# Patient Record
Sex: Female | Born: 1945 | Race: White | Hispanic: No | Marital: Married | Smoking: Never smoker
Health system: Southern US, Community
[De-identification: ages and names within clinical notes are randomized; demographics above are authoritative.]

## PROBLEM LIST (undated history)

## (undated) DIAGNOSIS — F419 Anxiety disorder, unspecified: Secondary | ICD-10-CM

## (undated) DIAGNOSIS — E079 Disorder of thyroid, unspecified: Secondary | ICD-10-CM

## (undated) DIAGNOSIS — E7800 Pure hypercholesterolemia, unspecified: Secondary | ICD-10-CM

## (undated) DIAGNOSIS — I10 Essential (primary) hypertension: Secondary | ICD-10-CM

## (undated) HISTORY — PX: ABDOMINAL HYSTERECTOMY: SHX81

## (undated) HISTORY — PX: CHOLECYSTECTOMY: SHX55

---

## 1999-09-04 ENCOUNTER — Other Ambulatory Visit: Admission: RE | Admit: 1999-09-04 | Discharge: 1999-09-04 | Payer: Self-pay | Admitting: Gynecology

## 2000-07-16 ENCOUNTER — Encounter: Admission: RE | Admit: 2000-07-16 | Discharge: 2000-07-16 | Payer: Self-pay | Admitting: Gynecology

## 2000-07-16 ENCOUNTER — Encounter: Payer: Self-pay | Admitting: Gynecology

## 2001-08-06 ENCOUNTER — Encounter: Admission: RE | Admit: 2001-08-06 | Discharge: 2001-08-06 | Payer: Self-pay | Admitting: Gynecology

## 2001-08-06 ENCOUNTER — Encounter: Payer: Self-pay | Admitting: Gynecology

## 2002-08-20 ENCOUNTER — Encounter: Payer: Self-pay | Admitting: Gynecology

## 2002-08-20 ENCOUNTER — Encounter: Admission: RE | Admit: 2002-08-20 | Discharge: 2002-08-20 | Payer: Self-pay | Admitting: Gynecology

## 2002-10-07 ENCOUNTER — Other Ambulatory Visit: Admission: RE | Admit: 2002-10-07 | Discharge: 2002-10-07 | Payer: Self-pay | Admitting: Gynecology

## 2003-08-31 ENCOUNTER — Encounter: Admission: RE | Admit: 2003-08-31 | Discharge: 2003-08-31 | Payer: Self-pay | Admitting: Gynecology

## 2004-09-25 ENCOUNTER — Encounter: Admission: RE | Admit: 2004-09-25 | Discharge: 2004-09-25 | Payer: Self-pay | Admitting: Gynecology

## 2004-11-01 ENCOUNTER — Other Ambulatory Visit: Admission: RE | Admit: 2004-11-01 | Discharge: 2004-11-01 | Payer: Self-pay | Admitting: Gynecology

## 2005-01-15 ENCOUNTER — Encounter: Admission: RE | Admit: 2005-01-15 | Discharge: 2005-01-15 | Payer: Self-pay | Admitting: Family Medicine

## 2005-01-22 ENCOUNTER — Encounter: Admission: RE | Admit: 2005-01-22 | Discharge: 2005-01-22 | Payer: Self-pay | Admitting: Family Medicine

## 2005-03-27 ENCOUNTER — Encounter (INDEPENDENT_AMBULATORY_CARE_PROVIDER_SITE_OTHER): Payer: Self-pay | Admitting: Specialist

## 2005-03-27 ENCOUNTER — Ambulatory Visit (HOSPITAL_COMMUNITY): Admission: RE | Admit: 2005-03-27 | Discharge: 2005-03-27 | Payer: Self-pay | Admitting: General Surgery

## 2005-11-12 ENCOUNTER — Encounter: Admission: RE | Admit: 2005-11-12 | Discharge: 2005-11-12 | Payer: Self-pay | Admitting: Gynecology

## 2006-11-17 ENCOUNTER — Encounter: Admission: RE | Admit: 2006-11-17 | Discharge: 2006-11-17 | Payer: Self-pay | Admitting: Gynecology

## 2007-04-24 ENCOUNTER — Encounter: Admission: RE | Admit: 2007-04-24 | Discharge: 2007-04-24 | Payer: Self-pay | Admitting: Family Medicine

## 2007-12-14 ENCOUNTER — Encounter: Admission: RE | Admit: 2007-12-14 | Discharge: 2007-12-14 | Payer: Self-pay | Admitting: Gynecology

## 2008-01-25 ENCOUNTER — Encounter: Admission: RE | Admit: 2008-01-25 | Discharge: 2008-01-25 | Payer: Self-pay | Admitting: Family Medicine

## 2008-06-25 IMAGING — US US ABDOMEN COMPLETE
1 series · 14 of 25 positions shown · non-contrast
Comparison: NONE

CLINICAL DATA: ATTN:Quirijn Amazigh Right upper quadrant pain 

ABDOMINAL ULTRASOUND

[Series 1: us abd · 0.32mm/px · 14 of 51 slices shown]
[im 1/51]
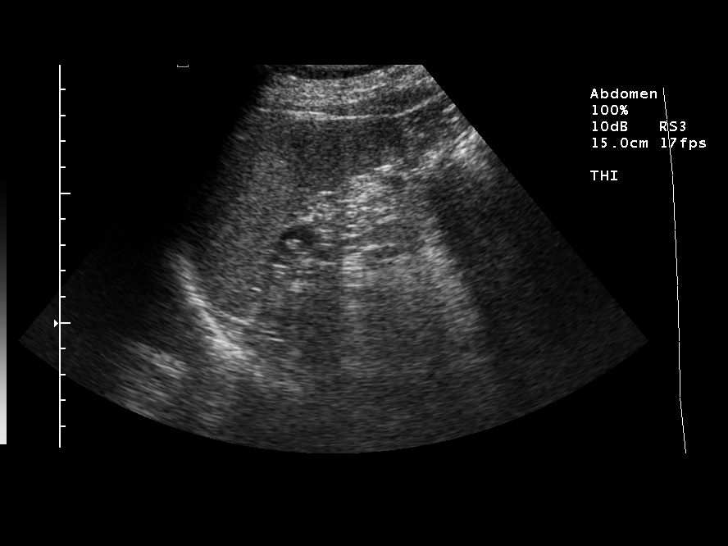
[im 5/51]
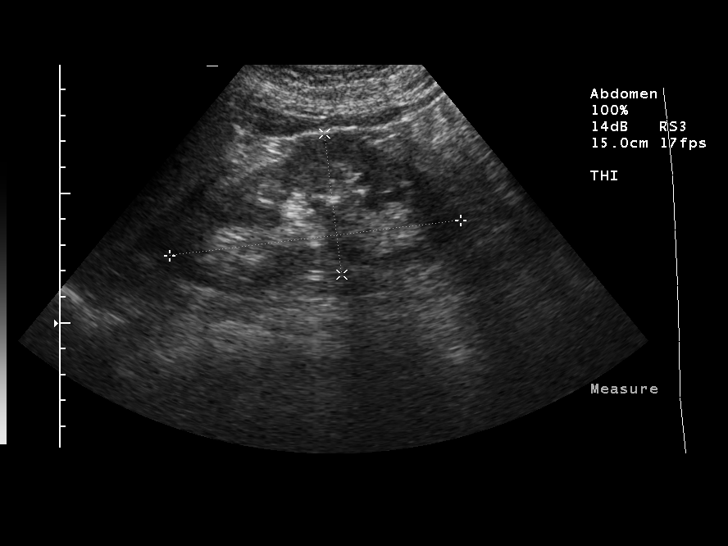
[im 9/51]
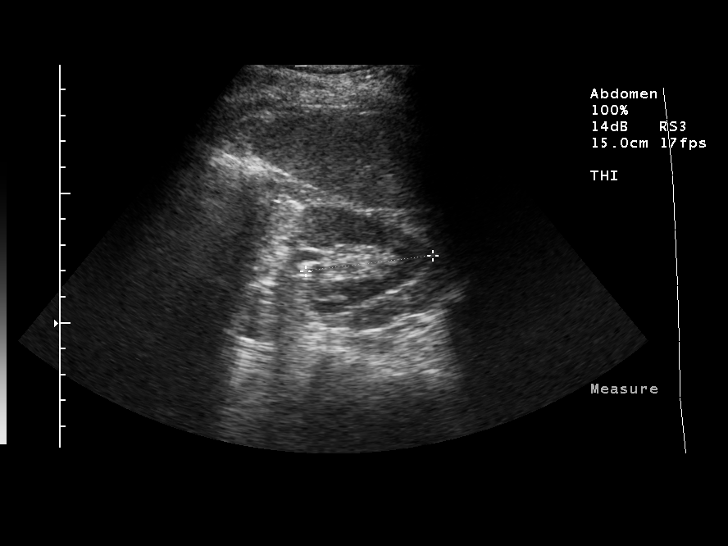
[im 13/51]
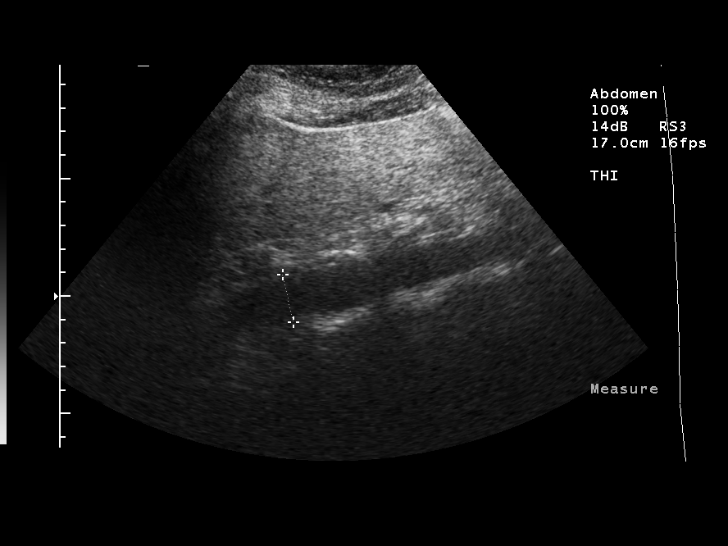
[im 17/51]
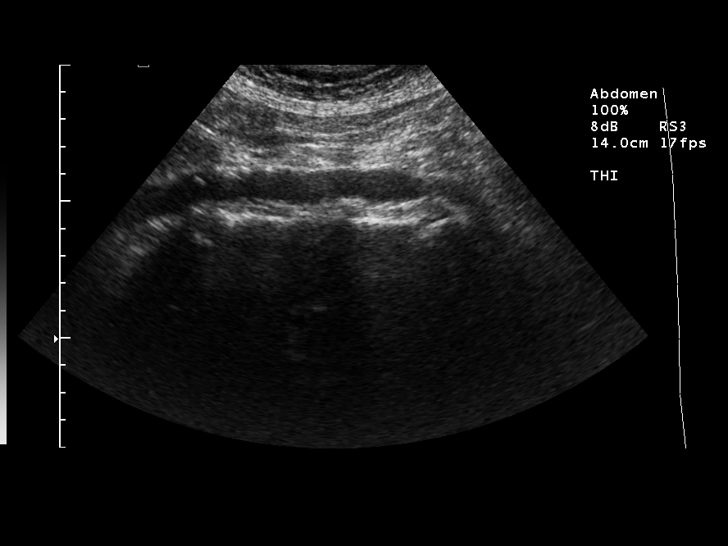
[im 19/51]
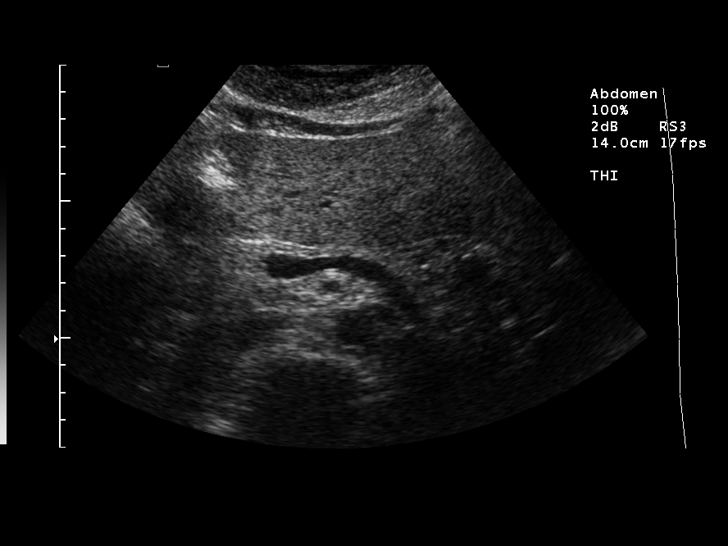
[im 23/51]
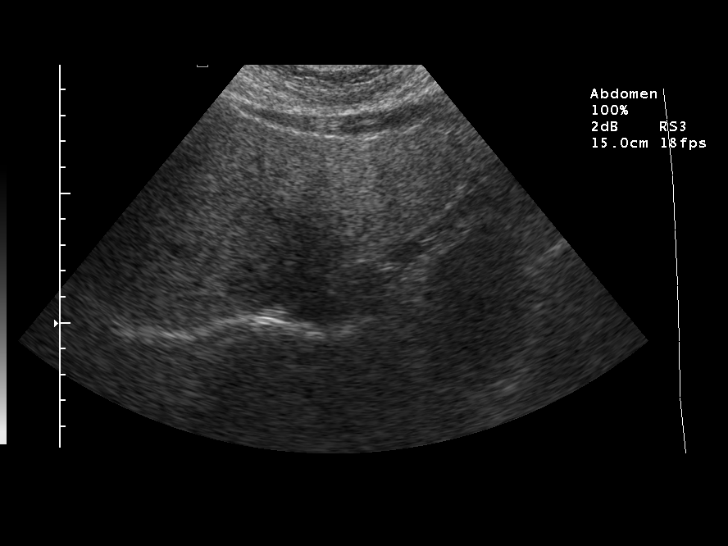
[im 28/51]
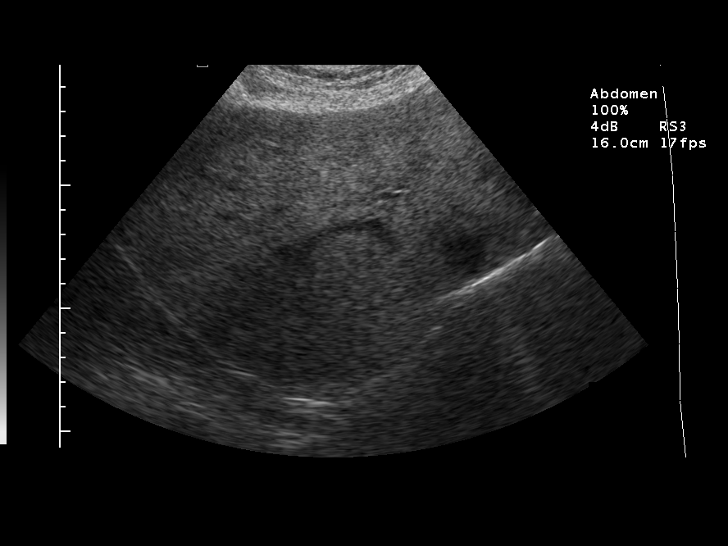
[im 32/51]
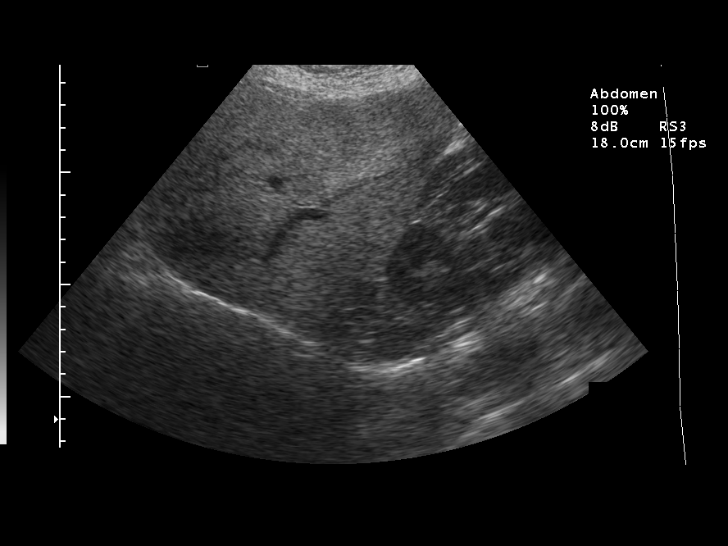
[im 34/51]
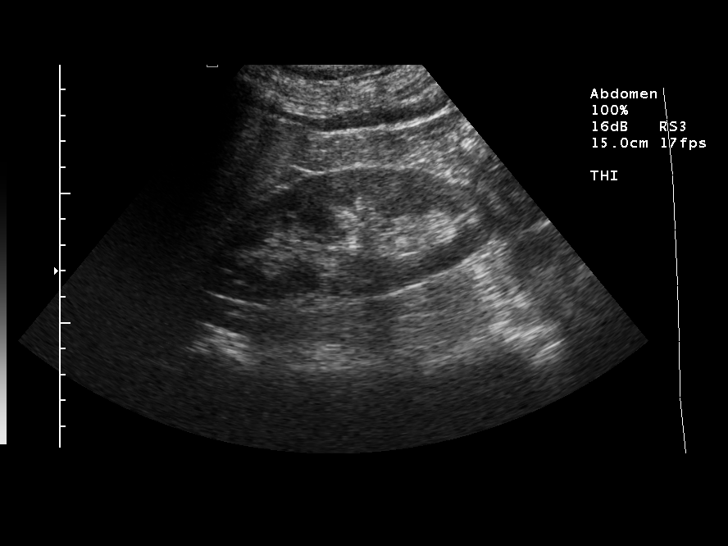
[im 38/51]
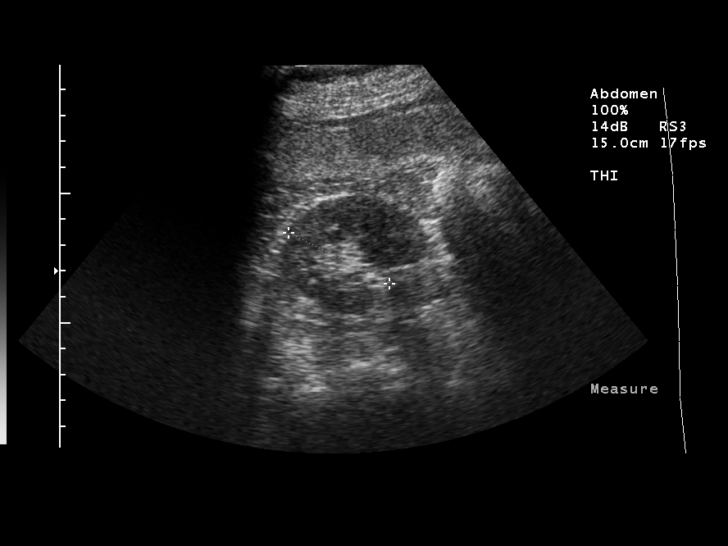
[im 42/51]
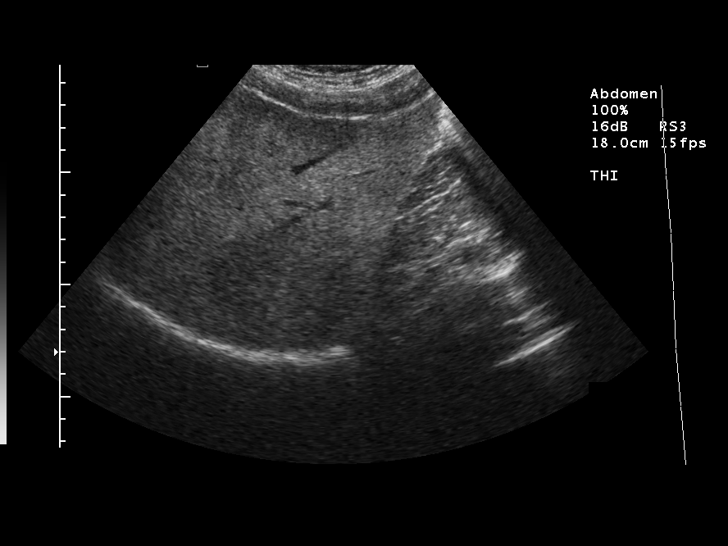
[im 46/51]
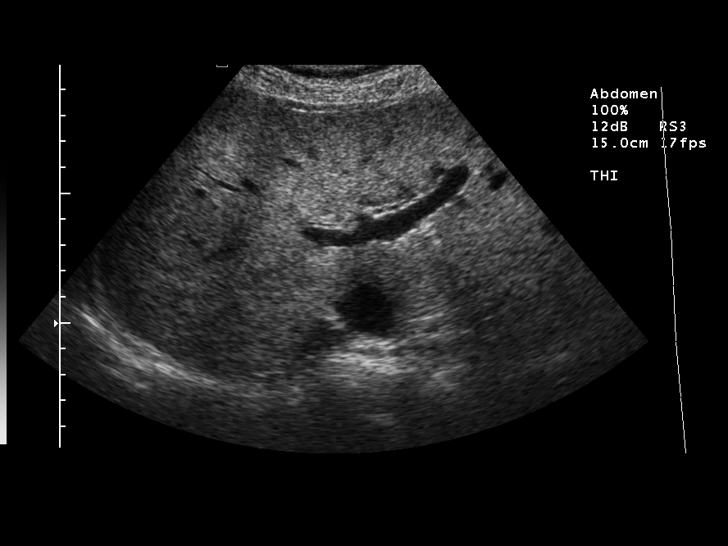
[im 51/51]
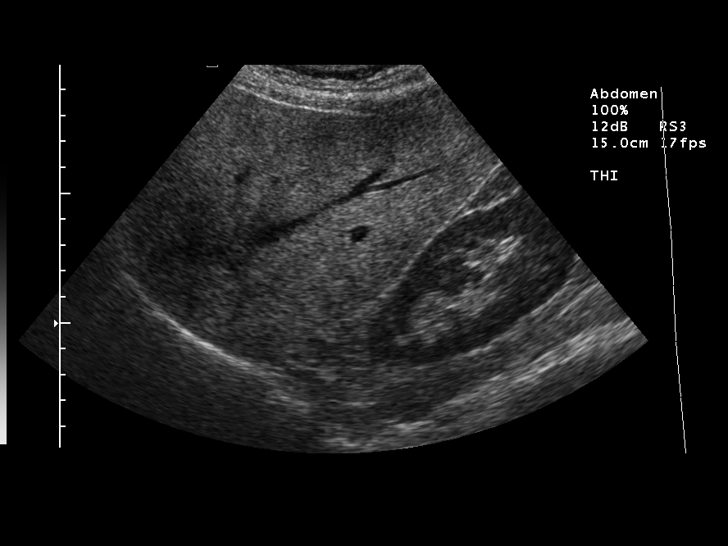

[14 of 25 positions shown; findings below may reference images not displayed]

FINDINGS: The liver is abnormal, showing mixed echotexture and 
size within normal limits. No discrete masses are visible. Surface 
is smooth. No ascites or varices. Spleen is 10 cm in length. 
Pancreas is unremarkable. Gallbladder is surgically absent. The 
common duct is 4 mm. The aorta is normal in caliber, mid diameter 
1.4 cm. Kidneys appear normal, renal lengths of 11.7 and 11.3 cm 
on the right and left respectively.
IMPRESSION: Abnormal liver echotexture, rule out focal fatty liver 
or other hepatic parenchymal disease. Marybell Issa, M.D. 
Date: 12/09/2006 [facility]  [REDACTED]

## 2009-01-24 ENCOUNTER — Encounter: Admission: RE | Admit: 2009-01-24 | Discharge: 2009-01-24 | Payer: Self-pay | Admitting: Gynecology

## 2010-11-18 ENCOUNTER — Encounter: Payer: Self-pay | Admitting: Gynecology

## 2011-03-15 NOTE — Op Note (Signed)
Kristine Carson, Kristine Carson               ACCOUNT NO.:  [id number]   MEDICAL RECORD NO.:  [id number]          PATIENT TYPE:  AMB   LOCATION:  DAY                          FACILITY:  [facility]   PHYSICIAN:  Ollen Gross. Vernell Morgans, M.D. DATE OF BIRTH:  11/15/1945   DATE OF PROCEDURE:  03/27/2005  DATE OF DISCHARGE:                                 OPERATIVE REPORT   PREOPERATIVE DIAGNOSIS:  Gallstones.   POSTOPERATIVE DIAGNOSIS:  Gallstones.   PROCEDURE:  Laparoscopic cholecystectomy with intraoperative cholangiogram.   SURGEON:  Ollen Gross. Carolynne Edouard, M.D.   ASSISTANT:  Anselm Pancoast. Zachery Dakins, M.D.   ANESTHESIA:  General endotracheal   DESCRIPTION OF PROCEDURE:  After informed consent was obtained, the patient  was brought to the operating room and placed in the supine position on the  operating table.  After adequate induction of general anesthesia, the  patient's abdomen was prepped with Betadine and draped in the usual sterile  manner.  The area below the umbilicus was infiltrated with 1/4% Marcaine.  A  small incision was made with a 15-blade knife.  This incision was carried  down through the subcutaneous tissue bluntly with a hemostat and [employer]  retractors until the linea alba was identified. The linea alba was incised  with a 15-blade knife and each side was grasped with Kocher clamps and  elevated anteriorly.   The preperitoneal space was then probed bluntly with a hemostat until the  peritoneum was opened and access was gained to the abdominal cavity.  A #0  Vicryl pursestring stitch was placed in the fascia surrounding the opening.  A Hasson cannula was placed through the opening and an anchored in place  with a 3-0 Vicryl pursestring stitch.  The abdomen was then insufflated with  carbon dioxide without difficulty.  The patient was placed in the head-up  position and rotated slightly with the right side up.  A laparoscope was  placed through the Hasson cannula and the right upper quadrant  was  inspected.  The dome of the gallbladder and liver readily identified.   Next, the epigastric region was infiltrated with 1/4% Marcaine.  A small  incision was made with the 15-blade knife.  A 10-mm port was then placed  bluntly through this incision into the abdominal cavity under direct vision.  Sites were then chosen laterally on the right side of the abdomen for  placement of 5-mm ports.  Each of these areas were infiltrated with 1/4%  Marcaine.   Small stab incisions were made with the 15-blade knife.  Then 5-mm ports  were placed bluntly through these incisions into the abdominal cavity under  direct vision.  A blunt grasper was placed through the lateral most 5-mm  port and used to grasp the dome of the gallbladder and elevate it anteriorly  and superiorly.  Some omental adhesions to the gallbladder body were then  taken down by blunt dissection with a dissector.  Another blunt dissector  was placed through the other 5-mm port and used to retract on the body and  neck of the gallbladder.   A dissector was  placed through the epigastric port and using the  electrocautery the peritoneal reflection of the gallbladder neck was opened.  Blunt dissection was then carried out into this area until the gallbladder  and the cystic duct junction was identified and dissected circumferentially  until a good window was created.  A single clip was placed proximally on the  gallbladder neck.  A small ductotomy was made just below the clip with the  laparoscopic scissors.  The 14-gauge Angiocath was placed percutaneously  through the anterior abdominal wall under direct vision.  A Reddick  cholangiogram catheter was placed through the Angiocath and flushed.  The  Reddick catheter was then placed within the cystic duct and anchored in  place with a clip.  A cholangiogram was obtained that showed no filling  defects, good emptying into the duodenum and adequate length on the cystic  duct.  The  anchoring clip and catheters were then removed from the patient.  Three clips were placed proximally on the cystic duct the was divided  between the two sets of clips.  Posterior to this, the cystic artery, with a  branch, was identified and dissected bluntly in a circumferential manner  until a good window was created.  Two clips were placed proximally and one  distally on the each branch of the artery; and the artery was divided  between the two.   Next, a laparoscopic hook cautery device was used to separate the  gallbladder from the liver bed.  Prior to completely detaching the  gallbladder from the liver bed, the liver bed was inspected and several  small bleeding points were coagulated with the electrocautery until the area  was completely hemostatic.  The gallbladder was then detached the rest of  the way from the liver bed without difficulty.  The laparoscope was then  moved to the epigastric port and a gallbladder grasper was placed through  the Hasson cannula and used to grasp the neck of the gallbladder.  The  gallbladder with the Hasson cannula was then removed through the  infraumbilical port without difficulty.  The fascial defect was then closed  with the previously placed Vicryl pursestring stitch as well as with another  figure-of-eight #0 Vicryl stitch.  The abdomen was then irrigated with  copious amounts of saline until the irrigant was clear.   The liver bed was inspected, again, and found to be completely hemostatic.  The rest of the ports were removed under direct vision and were all found to  be hemostatic.  The gas was allowed to escape.  The skin incisions were all  closed with interrupted 4-0 Monocryl subcuticular stitches.  Benzoin and  Steri-Strips were applied.  The patient tolerated the procedure well.  At  the end of the case all needle, sponge, and instrument counts were correct.  The patient was then awakened and taken to recovery room in  stable condition.       PST/MEDQ  D:  03/27/2005  T:  03/27/2005  Job:  [id number]

## 2012-07-15 ENCOUNTER — Other Ambulatory Visit: Payer: Self-pay | Admitting: Physician Assistant

## 2012-07-15 DIAGNOSIS — Z780 Asymptomatic menopausal state: Secondary | ICD-10-CM

## 2012-07-15 DIAGNOSIS — Z1231 Encounter for screening mammogram for malignant neoplasm of breast: Secondary | ICD-10-CM

## 2012-08-03 ENCOUNTER — Ambulatory Visit
Admission: RE | Admit: 2012-08-03 | Discharge: 2012-08-03 | Disposition: A | Discharge status: Home / Self Care | Payer: Medicare Other | Source: Ambulatory Visit | Attending: Physician Assistant | Admitting: Physician Assistant

## 2012-08-03 DIAGNOSIS — Z780 Asymptomatic menopausal state: Secondary | ICD-10-CM

## 2012-08-03 DIAGNOSIS — Z1231 Encounter for screening mammogram for malignant neoplasm of breast: Secondary | ICD-10-CM

## 2012-08-06 ENCOUNTER — Other Ambulatory Visit: Payer: Self-pay | Admitting: Physician Assistant

## 2012-08-06 DIAGNOSIS — R928 Other abnormal and inconclusive findings on diagnostic imaging of breast: Secondary | ICD-10-CM

## 2012-08-11 ENCOUNTER — Ambulatory Visit
Admission: RE | Admit: 2012-08-11 | Discharge: 2012-08-11 | Disposition: A | Discharge status: Home / Self Care | Payer: Medicare Other | Source: Ambulatory Visit | Attending: Physician Assistant | Admitting: Physician Assistant

## 2012-08-11 DIAGNOSIS — R928 Other abnormal and inconclusive findings on diagnostic imaging of breast: Secondary | ICD-10-CM

## 2012-09-22 ENCOUNTER — Other Ambulatory Visit: Payer: Self-pay | Admitting: Physician Assistant

## 2012-11-11 ENCOUNTER — Other Ambulatory Visit: Payer: Self-pay | Admitting: Physician Assistant

## 2013-02-02 ENCOUNTER — Other Ambulatory Visit: Payer: Self-pay | Admitting: Physician Assistant

## 2013-08-09 ENCOUNTER — Other Ambulatory Visit: Payer: Self-pay

## 2013-08-09 DIAGNOSIS — Z1231 Encounter for screening mammogram for malignant neoplasm of breast: Secondary | ICD-10-CM

## 2013-09-02 ENCOUNTER — Ambulatory Visit
Admission: RE | Admit: 2013-09-02 | Discharge: 2013-09-02 | Disposition: A | Discharge status: Home / Self Care | Payer: Medicare Other | Source: Ambulatory Visit

## 2013-09-02 DIAGNOSIS — Z1231 Encounter for screening mammogram for malignant neoplasm of breast: Secondary | ICD-10-CM

## 2014-01-17 ENCOUNTER — Other Ambulatory Visit: Payer: Self-pay | Admitting: Gastroenterology

## 2014-06-20 ENCOUNTER — Other Ambulatory Visit: Payer: Self-pay | Admitting: Physician Assistant

## 2014-06-20 DIAGNOSIS — E2839 Other primary ovarian failure: Secondary | ICD-10-CM

## 2014-08-02 ENCOUNTER — Other Ambulatory Visit: Payer: Self-pay | Admitting: Physician Assistant

## 2014-08-02 DIAGNOSIS — Z1239 Encounter for other screening for malignant neoplasm of breast: Secondary | ICD-10-CM

## 2014-08-19 ENCOUNTER — Other Ambulatory Visit: Payer: Self-pay

## 2014-08-19 ENCOUNTER — Other Ambulatory Visit: Payer: Self-pay | Admitting: Physician Assistant

## 2014-08-19 DIAGNOSIS — Z1231 Encounter for screening mammogram for malignant neoplasm of breast: Secondary | ICD-10-CM

## 2014-09-05 ENCOUNTER — Other Ambulatory Visit: Payer: Self-pay | Admitting: Physician Assistant

## 2014-09-05 DIAGNOSIS — M810 Age-related osteoporosis without current pathological fracture: Secondary | ICD-10-CM

## 2014-09-07 ENCOUNTER — Encounter (INDEPENDENT_AMBULATORY_CARE_PROVIDER_SITE_OTHER): Payer: Self-pay

## 2014-09-07 ENCOUNTER — Ambulatory Visit
Admission: RE | Admit: 2014-09-07 | Discharge: 2014-09-07 | Disposition: A | Discharge status: Home / Self Care | Payer: Medicare HMO | Source: Ambulatory Visit

## 2014-09-07 DIAGNOSIS — Z1231 Encounter for screening mammogram for malignant neoplasm of breast: Secondary | ICD-10-CM

## 2014-09-13 ENCOUNTER — Ambulatory Visit
Admission: RE | Admit: 2014-09-13 | Discharge: 2014-09-13 | Disposition: A | Discharge status: Home / Self Care | Payer: Medicare HMO | Source: Ambulatory Visit | Attending: Physician Assistant | Admitting: Physician Assistant

## 2014-09-13 DIAGNOSIS — M810 Age-related osteoporosis without current pathological fracture: Secondary | ICD-10-CM

## 2015-09-27 ENCOUNTER — Other Ambulatory Visit: Payer: Self-pay

## 2015-09-27 DIAGNOSIS — Z1231 Encounter for screening mammogram for malignant neoplasm of breast: Secondary | ICD-10-CM

## 2015-10-24 ENCOUNTER — Ambulatory Visit
Admission: RE | Admit: 2015-10-24 | Discharge: 2015-10-24 | Disposition: A | Discharge status: Home / Self Care | Payer: Medicare HMO | Source: Ambulatory Visit

## 2015-10-24 DIAGNOSIS — Z1231 Encounter for screening mammogram for malignant neoplasm of breast: Secondary | ICD-10-CM

## 2016-09-14 ENCOUNTER — Emergency Department (HOSPITAL_COMMUNITY): Payer: Medicare HMO

## 2016-09-14 ENCOUNTER — Encounter (HOSPITAL_COMMUNITY): Payer: Self-pay | Admitting: *Deleted

## 2016-09-14 ENCOUNTER — Emergency Department (HOSPITAL_COMMUNITY)
Admission: EM | Admit: 2016-09-14 | Discharge: 2016-09-14 | Disposition: A | Discharge status: Home / Self Care | Payer: Medicare HMO | Attending: Emergency Medicine | Admitting: Emergency Medicine

## 2016-09-14 DIAGNOSIS — Y92410 Unspecified street and highway as the place of occurrence of the external cause: Secondary | ICD-10-CM | POA: Insufficient documentation

## 2016-09-14 DIAGNOSIS — I10 Essential (primary) hypertension: Secondary | ICD-10-CM | POA: Diagnosis not present

## 2016-09-14 DIAGNOSIS — S20212A Contusion of left front wall of thorax, initial encounter: Secondary | ICD-10-CM | POA: Diagnosis not present

## 2016-09-14 DIAGNOSIS — Y939 Activity, unspecified: Secondary | ICD-10-CM | POA: Diagnosis not present

## 2016-09-14 DIAGNOSIS — S0081XA Abrasion of other part of head, initial encounter: Secondary | ICD-10-CM | POA: Diagnosis not present

## 2016-09-14 DIAGNOSIS — R52 Pain, unspecified: Secondary | ICD-10-CM

## 2016-09-14 DIAGNOSIS — Y999 Unspecified external cause status: Secondary | ICD-10-CM | POA: Insufficient documentation

## 2016-09-14 DIAGNOSIS — S40812A Abrasion of left upper arm, initial encounter: Secondary | ICD-10-CM | POA: Insufficient documentation

## 2016-09-14 DIAGNOSIS — S299XXA Unspecified injury of thorax, initial encounter: Secondary | ICD-10-CM | POA: Diagnosis present

## 2016-09-14 HISTORY — DX: Disorder of thyroid, unspecified: E07.9

## 2016-09-14 HISTORY — DX: Pure hypercholesterolemia, unspecified: E78.00

## 2016-09-14 HISTORY — DX: Anxiety disorder, unspecified: F41.9

## 2016-09-14 HISTORY — DX: Essential (primary) hypertension: I10

## 2016-09-14 MED ORDER — ACETAMINOPHEN 500 MG PO TABS
1000.0000 mg | ORAL_TABLET | Freq: Once | ORAL | Status: AC
  Administered 2016-09-14: 1000 mg via ORAL
  Filled 2016-09-14: qty 2

## 2016-09-14 NOTE — ED Notes (Signed)
Pt to go next for CT scan
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
## 2016-09-14 NOTE — ED Notes (Signed)
Pt ambulatory to D35; pt fully dressed. Informed pt she is waiting for a CT scan on neck. Pt is not wearing a c-collar. Steady gait noted

## 2016-09-14 NOTE — ED Notes (Signed)
ED Provider at bedside. 

## 2016-10-04 ENCOUNTER — Other Ambulatory Visit: Payer: Self-pay | Admitting: Physician Assistant

## 2016-10-04 DIAGNOSIS — M810 Age-related osteoporosis without current pathological fracture: Secondary | ICD-10-CM

## 2016-10-10 ENCOUNTER — Other Ambulatory Visit: Payer: Self-pay | Admitting: Physician Assistant

## 2016-10-10 DIAGNOSIS — Z1231 Encounter for screening mammogram for malignant neoplasm of breast: Secondary | ICD-10-CM

## 2016-11-18 ENCOUNTER — Ambulatory Visit
Admission: RE | Admit: 2016-11-18 | Discharge: 2016-11-18 | Disposition: A | Discharge status: Home / Self Care | Payer: Medicare HMO | Source: Ambulatory Visit | Attending: Physician Assistant | Admitting: Physician Assistant

## 2016-11-18 DIAGNOSIS — M810 Age-related osteoporosis without current pathological fracture: Secondary | ICD-10-CM

## 2016-11-18 DIAGNOSIS — Z1231 Encounter for screening mammogram for malignant neoplasm of breast: Secondary | ICD-10-CM

## 2017-12-24 ENCOUNTER — Other Ambulatory Visit: Payer: Self-pay | Admitting: Physician Assistant

## 2017-12-24 DIAGNOSIS — Z139 Encounter for screening, unspecified: Secondary | ICD-10-CM

## 2018-01-13 ENCOUNTER — Ambulatory Visit
Admission: RE | Admit: 2018-01-13 | Discharge: 2018-01-13 | Disposition: A | Discharge status: Home / Self Care | Payer: Medicare HMO | Source: Ambulatory Visit | Attending: Physician Assistant | Admitting: Physician Assistant

## 2018-01-13 DIAGNOSIS — Z139 Encounter for screening, unspecified: Secondary | ICD-10-CM

## 2018-11-20 ENCOUNTER — Other Ambulatory Visit: Payer: Self-pay | Admitting: Physician Assistant

## 2018-11-20 DIAGNOSIS — Z1231 Encounter for screening mammogram for malignant neoplasm of breast: Secondary | ICD-10-CM

## 2018-11-20 DIAGNOSIS — E2839 Other primary ovarian failure: Secondary | ICD-10-CM

## 2019-01-19 ENCOUNTER — Other Ambulatory Visit: Payer: Medicare HMO

## 2019-01-19 ENCOUNTER — Ambulatory Visit: Payer: Medicare HMO

## 2019-02-25 ENCOUNTER — Other Ambulatory Visit: Payer: Medicare HMO

## 2019-02-25 ENCOUNTER — Ambulatory Visit: Payer: Medicare HMO

## 2019-04-16 ENCOUNTER — Ambulatory Visit
Admission: RE | Admit: 2019-04-16 | Discharge: 2019-04-16 | Disposition: A | Discharge status: Home / Self Care | Payer: Medicare HMO | Source: Ambulatory Visit | Attending: Physician Assistant | Admitting: Physician Assistant

## 2019-04-16 ENCOUNTER — Other Ambulatory Visit: Payer: Self-pay

## 2019-04-16 DIAGNOSIS — Z1231 Encounter for screening mammogram for malignant neoplasm of breast: Secondary | ICD-10-CM

## 2019-04-16 DIAGNOSIS — E2839 Other primary ovarian failure: Secondary | ICD-10-CM

## 2019-09-02 ENCOUNTER — Other Ambulatory Visit: Payer: Self-pay

## 2019-09-02 DIAGNOSIS — Z20822 Contact with and (suspected) exposure to covid-19: Secondary | ICD-10-CM

## 2019-09-03 LAB — NOVEL CORONAVIRUS, NAA: SARS-CoV-2, NAA: NOT DETECTED

## 2020-04-21 ENCOUNTER — Other Ambulatory Visit: Payer: Self-pay | Admitting: Physician Assistant

## 2020-04-21 DIAGNOSIS — Z1231 Encounter for screening mammogram for malignant neoplasm of breast: Secondary | ICD-10-CM

## 2020-05-03 ENCOUNTER — Other Ambulatory Visit: Payer: Self-pay

## 2020-05-03 ENCOUNTER — Ambulatory Visit
Admission: RE | Admit: 2020-05-03 | Discharge: 2020-05-03 | Disposition: A | Discharge status: Home / Self Care | Payer: Medicare HMO | Source: Ambulatory Visit | Attending: Physician Assistant | Admitting: Physician Assistant

## 2020-05-03 DIAGNOSIS — Z1231 Encounter for screening mammogram for malignant neoplasm of breast: Secondary | ICD-10-CM

## 2020-06-12 ENCOUNTER — Other Ambulatory Visit: Payer: Self-pay

## 2020-06-12 ENCOUNTER — Ambulatory Visit: Payer: Medicare HMO | Attending: Nurse Practitioner | Admitting: Physical Therapy

## 2020-06-12 DIAGNOSIS — R293 Abnormal posture: Secondary | ICD-10-CM | POA: Diagnosis present

## 2020-06-12 DIAGNOSIS — M5442 Lumbago with sciatica, left side: Secondary | ICD-10-CM | POA: Diagnosis present

## 2020-06-12 NOTE — Therapy (Signed)
[facility] [address] [place], [place], [address] Phone: [phone]   Fax:  [phone]  Physical Therapy Evaluation  Patient Details  Name: Kristine Carson MRN: [id number] Date of Birth: 1946/09/01 Referring Provider (PT): Roseanna Rainbow NP.   Encounter Date: 06/12/2020   PT End of Session - 06/12/20 1201    Visit Number 1    Number of Visits 12    Date for PT Re-Evaluation 07/24/20    Authorization Type FOTO.    PT Start Time 1035    PT Stop Time 1126    PT Time Calculation (min) 51 min    Activity Tolerance Patient tolerated treatment well    Behavior During Therapy WFL for tasks assessed/performed           Past Medical History:  Diagnosis Date  . Anxiety   . High cholesterol   . Hypertension   . Thyroid disease     No past surgical history on file.  There were no vitals filed for this visit.    Subjective Assessment - 06/12/20 1214    Subjective COVID-19 screen performed prior to patient entering clinic.  The patient presents to the clinic today with her daughter.  She reports the onset of low back pai with radiation down her left LE for about two months.  She feel it may have been related to attemtping to avoid a fall which involved twisting her spine.  She is not reporting any pain today but her pain can become very intense after long periods of standing and sitting described as sore, sharp and shooting.  She reports pain goes into her left inner thigh region and and radiates below her knee.    Pertinent History HTN, Thyroid disease, Cholecystectomy.    How long can you sit comfortably? Varies.    How long can you stand comfortably? Varies.    Patient Stated Goals Get out of pain.    Currently in Pain? No/denies              [facility] PT Assessment - 06/12/20 0001      Assessment   Medical Diagnosis Acute left-sided low back pain with left-sided sciatica.    Referring Provider (PT) Roseanna Rainbow NP.    Onset  Date/Surgical Date --   ~2 months.     Precautions   Precautions Fall      Restrictions   Weight Bearing Restrictions No      Balance Screen   Has the patient fallen in the past 6 months Yes   3 times from a squat position.   Has the patient had a decrease in activity level because of a fear of falling?  No    Is the patient reluctant to leave their home because of a fear of falling?  No      Home Environment   Living Environment Private residence      Prior Function   Level of Independence Independent      Posture/Postural Control   Posture/Postural Control Postural limitations    Postural Limitations Rounded Shoulders;Forward head;Decreased lumbar lordosis      Deep Tendon Reflexes   DTR Assessment Site Patella;Achilles    Patella DTR 2+    Achilles DTR --   RT= 1+/4+ and LT= 2+/4+.     ROM / Strength   AROM / PROM / Strength AROM;Strength      AROM   Overall AROM Comments Full active lumbar flexion and extension.  Strength   Overall Strength Comments Normal bilateral LE strength.      Palpation   Palpation comment Tender to palption over left SIJ and especially over her left upper gluteal region.      Special Tests   Other special tests (-) SLR, FABER and Sacral press testing.  Right LE longer than left.      Ambulation/Gait   Gait Comments Essentially normal this morning.                      Objective measurements completed on examination: See above findings.       [facility] Adult PT Treatment/Exercise - 06/12/20 0001      Modalities   Modalities Electrical Stimulation      Electrical Stimulation   Electrical Stimulation Location Left SIJ/upper glut.    Electrical Stimulation Action Pre-mod.    Electrical Stimulation Parameters 80-150 Hz x 20 minutes.    Electrical Stimulation Goals Tone;Pain                       PT Long Term Goals - 06/12/20 1240      PT LONG TERM GOAL #1   Title Independent with a HEP.    Time 6      Period Weeks    Status New      PT LONG TERM GOAL #2   Title Sit 30 minutes with pain not > 2-3/10.    Time 6    Period Weeks    Status New      PT LONG TERM GOAL #3   Title Stand 30 minutes with pain not > 2-3/10.    Time 6    Period Weeks    Status New      PT LONG TERM GOAL #4   Title Eliminate left LE symptoms.    Time 6    Period Weeks    Status New                  Plan - 06/12/20 1234    Clinical Impression Statement The patient presents to OPPT with c/o left sided low back pain and radiation of pain down her left LE.  This has been ongoing for about two months.  It is thought her pain may have been the result of attemtping to avoid a fall and twisting her spine.  She was found to be tender to palpation over her left SIJ and upper gluteal region.  Her pain can become very intense with prolonged sitting and standing.  Patient will benefit from skilled physical therapy intervention to address deficits and pain.    Personal Factors and Comorbidities Comorbidity 1;Comorbidity 2    Comorbidities HTN, Thyroid disease, Cholecystectomy.    Examination-Activity Limitations Bed Mobility;Squat;Lift;Other;Sit;Stand    Examination-Participation Restrictions Other    Stability/Clinical Decision Making Evolving/Moderate complexity    Clinical Decision Making Low    PT Frequency 2x / week    PT Duration 6 weeks    PT Treatment/Interventions ADLs/Self Care Home Management;Cryotherapy;Electrical Stimulation;Ultrasound;Traction;Moist Heat;Iontophoresis 4mg /ml Dexamethasone;Therapeutic activities;Therapeutic exercise;Manual techniques;Passive range of motion;Dry needling;Joint Manipulations;Spinal Manipulations    PT Next Visit Plan Adductor squeeze f/b left SKTC to equalize leg lengths, core exercise progression, combo e'stim/US and STW/M to patient's left SIJ/upper gluteal region.  Consider intermittment traction beginning at 35% body weight.    Consulted and Agree with Plan of  Care Patient           Patient will benefit from  skilled therapeutic intervention in order to improve the following deficits and impairments:  Pain, Decreased activity tolerance, Increased muscle spasms, Postural dysfunction  Visit Diagnosis: Acute left-sided low back pain with left-sided sciatica - Plan: PT plan of care cert/re-cert  Abnormal posture - Plan: PT plan of care cert/re-cert     Problem List There are no problems to display for this patient.   Kristine Carson, Kristine Carson 06/12/2020, 12:44 PM  [facility] [address] [place], [place], [address] Phone: [phone]   Fax:  [phone]  Name: Kristine Carson MRN: [id number] Date of Birth: 30-Nov-1945

## 2020-06-21 ENCOUNTER — Encounter: Payer: Self-pay | Admitting: Physical Therapy

## 2020-06-21 ENCOUNTER — Ambulatory Visit: Payer: Medicare HMO | Admitting: Physical Therapy

## 2020-06-21 ENCOUNTER — Other Ambulatory Visit: Payer: Self-pay

## 2020-06-21 DIAGNOSIS — R293 Abnormal posture: Secondary | ICD-10-CM

## 2020-06-21 DIAGNOSIS — M5442 Lumbago with sciatica, left side: Secondary | ICD-10-CM

## 2020-06-21 NOTE — Therapy (Signed)
[facility] [address] [place], [place], [address] Phone: [phone]   Fax:  [phone]  Physical Therapy Treatment  Patient Details  Name: Kristine Carson MRN: [id number] Date of Birth: 1946-10-06 Referring Provider (PT): Roseanna Rainbow NP.   Encounter Date: 06/21/2020   PT End of Session - 06/21/20 1225    Visit Number 2    Number of Visits 12    Date for PT Re-Evaluation 07/24/20    Authorization Type FOTO.    PT Start Time 0906    PT Stop Time 1000    PT Time Calculation (min) 54 min    Activity Tolerance Patient tolerated treatment well    Behavior During Therapy WFL for tasks assessed/performed           Past Medical History:  Diagnosis Date  . Anxiety   . High cholesterol   . Hypertension   . Thyroid disease     History reviewed. No pertinent surgical history.  There were no vitals filed for this visit.   Subjective Assessment - 06/21/20 1223    Subjective COVID 19 screening performed on patient upon arrival. Patient has TENS unit at home and reports benefit when used. Patient only has pain intermittantly and is still experiencing LLE pain.    Pertinent History HTN, Thyroid disease, Cholecystectomy.    How long can you sit comfortably? Varies.    How long can you stand comfortably? Varies.    Patient Stated Goals Get out of pain.    Currently in Pain? No/denies              [facility] PT Assessment - 06/21/20 0001      Assessment   Medical Diagnosis Acute left-sided low back pain with left-sided sciatica.    Referring Provider (PT) Roseanna Rainbow NP.      Precautions   Precautions Fall      Restrictions   Weight Bearing Restrictions No                         [facility] Adult PT Treatment/Exercise - 06/21/20 0001      Exercises   Exercises Lumbar      Lumbar Exercises: Stretches   Single Knee to Chest Stretch Left;3 reps;30 seconds      Lumbar Exercises: Supine   Ab Set 15 reps;5 seconds    Glut  Set 5 seconds;20 reps    Bridge 20 reps;5 seconds    Other Supine Lumbar Exercises Isometric LLE hip ext into wall x10 reps 10 sec    Other Supine Lumbar Exercises Adductor squeeze 3x30 sec      Modalities   Modalities Traction      Traction   Type of Traction Lumbar    Min (lbs) 5    Max (lbs) 58    Hold Time 99    Rest Time 5    Time 15                       PT Long Term Goals - 06/12/20 1240      PT LONG TERM GOAL #1   Title Independent with a HEP.    Time 6    Period Weeks    Status New      PT LONG TERM GOAL #2   Title Sit 30 minutes with pain not > 2-3/10.    Time 6    Period Weeks    Status New  PT LONG TERM GOAL #3   Title Stand 30 minutes with pain not > 2-3/10.    Time 6    Period Weeks    Status New      PT LONG TERM GOAL #4   Title Eliminate left LE symptoms.    Time 6    Period Weeks    Status New                 Plan - 06/21/20 1225    Clinical Impression Statement Patient presented in clinic with no current LBP but still has intermittant pain. Patient progress through corrective pelvic exercises and core strengthening without complaints of pain. Increased mutilmodal cueing provided throughout therex to ensure proper mobility. Traction initated at 58# max with patient educated to monitor symptoms until next appointment.    Personal Factors and Comorbidities Comorbidity 1;Comorbidity 2    Comorbidities HTN, Thyroid disease, Cholecystectomy.    Examination-Activity Limitations Bed Mobility;Squat;Lift;Other;Sit;Stand    Examination-Participation Restrictions Other    Stability/Clinical Decision Making Evolving/Moderate complexity    PT Frequency 2x / week    PT Duration 6 weeks    PT Treatment/Interventions ADLs/Self Care Home Management;Cryotherapy;Electrical Stimulation;Ultrasound;Traction;Moist Heat;Iontophoresis 4mg /ml Dexamethasone;Therapeutic activities;Therapeutic exercise;Manual techniques;Passive range of motion;Dry  needling;Joint Manipulations;Spinal Manipulations    PT Next Visit Plan Adductor squeeze f/b left SKTC to equalize leg lengths, core exercise progression, combo e'stim/US and STW/M to patient's left SIJ/upper gluteal region.  Consider intermittment traction beginning at 35% body weight.    Consulted and Agree with Plan of Care Patient           Patient will benefit from skilled therapeutic intervention in order to improve the following deficits and impairments:  Pain, Decreased activity tolerance, Increased muscle spasms, Postural dysfunction  Visit Diagnosis: Acute left-sided low back pain with left-sided sciatica  Abnormal posture     Problem List There are no problems to display for this patient.   , PTA 06/21/2020, 4:10 PM  [facility] [address] [place], [place], [place] Phone: [phone]   Fax:  [phone]  Name: Kristine Carson MRN: Honor Loh Date of Birth: 01/07/1946

## 2020-06-28 ENCOUNTER — Other Ambulatory Visit: Payer: Self-pay

## 2020-06-28 ENCOUNTER — Encounter: Payer: Self-pay | Admitting: Physical Therapy

## 2020-06-28 ENCOUNTER — Ambulatory Visit: Payer: Medicare HMO | Attending: Nurse Practitioner | Admitting: Physical Therapy

## 2020-06-28 DIAGNOSIS — R293 Abnormal posture: Secondary | ICD-10-CM | POA: Insufficient documentation

## 2020-06-28 DIAGNOSIS — M5442 Lumbago with sciatica, left side: Secondary | ICD-10-CM

## 2020-06-28 NOTE — Therapy (Signed)
[facility] [address] [place], [place], [address] Phone: [phone]   Fax:  [phone]  Physical Therapy Treatment  Patient Details  Name: Kristine Carson MRN: [id number] Date of Birth: September 08, 1946 Referring Provider (PT): Roseanna Rainbow NP.   Encounter Date: 06/28/2020   PT End of Session - 06/28/20 0950    Visit Number 3    Number of Visits 12    Date for PT Re-Evaluation 07/24/20    Authorization Type FOTO.    PT Start Time [phone]    PT Stop Time 1031    PT Time Calculation (min) 41 min    Activity Tolerance Patient tolerated treatment well    Behavior During Therapy WFL for tasks assessed/performed           Past Medical History:  Diagnosis Date  . Anxiety   . High cholesterol   . Hypertension   . Thyroid disease     History reviewed. No pertinent surgical history.  There were no vitals filed for this visit.   Subjective Assessment - 06/28/20 0949    Subjective COVID 19 screening performed on patient upon arrival. Patient reports that the second day after traction she had more R low to mid back pain. Had to use ice constantly.    Pertinent History HTN, Thyroid disease, Cholecystectomy.    How long can you sit comfortably? Varies.    How long can you stand comfortably? Varies.    Patient Stated Goals Get out of pain.    Currently in Pain? Yes    Pain Score 5     Pain Location Back    Pain Orientation Left;Mid;Lower    Pain Descriptors / Indicators Sore    Pain Type Chronic pain    Pain Onset In the past 7 days    Pain Frequency Constant              [facility] PT Assessment - 06/28/20 0001      Assessment   Medical Diagnosis Acute left-sided low back pain with left-sided sciatica.    Referring Provider (PT) Roseanna Rainbow NP.      Precautions   Precautions Fall      Restrictions   Weight Bearing Restrictions No                         [facility] Adult PT Treatment/Exercise - 06/28/20 0001       Modalities   Modalities Electrical Stimulation;Moist Heat;Ultrasound      Moist Heat Therapy   Number Minutes Moist Heat 15 Minutes    Moist Heat Location Lumbar Spine      Electrical Stimulation   Electrical Stimulation Location L lumbar paraspinals    Electrical Stimulation Action Pre-Mod    Electrical Stimulation Parameters 80-150 hz x10 mi    Electrical Stimulation Goals Tone;Pain      Ultrasound   Ultrasound Location L low back    Ultrasound Parameters Combo 1.5 w/cm2, 100%, 1 mhz x10 min    Ultrasound Goals Pain      Manual Therapy   Manual Therapy Soft tissue mobilization    Soft tissue mobilization STW to L lumbar paraspinals, QL to reduce tone and pain                       PT Long Term Goals - 06/12/20 1240      PT LONG TERM GOAL #1   Title Independent with a HEP.  Time 6    Period Weeks    Status New      PT LONG TERM GOAL #2   Title Sit 30 minutes with pain not > 2-3/10.    Time 6    Period Weeks    Status New      PT LONG TERM GOAL #3   Title Stand 30 minutes with pain not > 2-3/10.    Time 6    Period Weeks    Status New      PT LONG TERM GOAL #4   Title Eliminate left LE symptoms.    Time 6    Period Weeks    Status New                 Plan - 06/28/20 1220    Clinical Impression Statement Patient presented in clinic with reports of increased LBP since last treatment. Patient has used ice and TENS unit to control pain at home. Patient presented with mod tone of L lumbar paraspinals and QL. Normal modalities response noted following removal of the modalities. No negetive complaints following end of session.    Personal Factors and Comorbidities Comorbidity 1;Comorbidity 2    Comorbidities HTN, Thyroid disease, Cholecystectomy.    Examination-Activity Limitations Bed Mobility;Squat;Lift;Other;Sit;Stand    Examination-Participation Restrictions Other    Stability/Clinical Decision Making Evolving/Moderate complexity    PT  Frequency 2x / week    PT Duration 6 weeks    PT Treatment/Interventions ADLs/Self Care Home Management;Cryotherapy;Electrical Stimulation;Ultrasound;Traction;Moist Heat;Iontophoresis 4mg /ml Dexamethasone;Therapeutic activities;Therapeutic exercise;Manual techniques;Passive range of motion;Dry needling;Joint Manipulations;Spinal Manipulations    PT Next Visit Plan Adductor squeeze f/b left SKTC to equalize leg lengths, core exercise progression, combo e'stim/US and STW/M to patient's left SIJ/upper gluteal region.  Consider intermittment traction beginning at 35% body weight.    Consulted and Agree with Plan of Care Patient           Patient will benefit from skilled therapeutic intervention in order to improve the following deficits and impairments:  Pain, Decreased activity tolerance, Increased muscle spasms, Postural dysfunction  Visit Diagnosis: Acute left-sided low back pain with left-sided sciatica  Abnormal posture     Problem List There are no problems to display for this patient.   , PTA 06/28/2020, 12:22 PM  [facility] [address] [place], [place], [place] Phone: [phone]   Fax:  [phone]  Name: Kristine Carson MRN: Honor Loh Date of Birth: 02-Nov-1945

## 2020-07-05 ENCOUNTER — Encounter: Payer: Self-pay | Admitting: Physical Therapy

## 2020-07-05 ENCOUNTER — Other Ambulatory Visit: Payer: Self-pay

## 2020-07-05 ENCOUNTER — Ambulatory Visit: Payer: Medicare HMO | Admitting: Physical Therapy

## 2020-07-05 DIAGNOSIS — M5442 Lumbago with sciatica, left side: Secondary | ICD-10-CM | POA: Diagnosis not present

## 2020-07-05 DIAGNOSIS — R293 Abnormal posture: Secondary | ICD-10-CM

## 2020-07-05 NOTE — Therapy (Signed)
[facility] [address] [place], [place], [address] Phone: [phone]   Fax:  [phone]  Physical Therapy Treatment  Patient Details  Name: Kristine Carson MRN: [id number] Date of Birth: Jan 01, 1946 Referring Provider (PT): Roseanna Rainbow NP.   Encounter Date: 07/05/2020   PT End of Session - 07/05/20 1041    Visit Number 4    Number of Visits 12    Date for PT Re-Evaluation 07/24/20    Authorization Type FOTO.    PT Start Time 0945    PT Stop Time 1043    PT Time Calculation (min) 58 min    Activity Tolerance Patient tolerated treatment well    Behavior During Therapy WFL for tasks assessed/performed           Past Medical History:  Diagnosis Date  . Anxiety   . High cholesterol   . Hypertension   . Thyroid disease     History reviewed. No pertinent surgical history.  There were no vitals filed for this visit.   Subjective Assessment - 07/05/20 1040    Subjective COVID-19 screen performed prior to patient entering clinic.  Much better now.    Pertinent History HTN, Thyroid disease, Cholecystectomy.    How long can you sit comfortably? Varies.    How long can you stand comfortably? Varies.    Patient Stated Goals Get out of pain.    Currently in Pain? Yes    Pain Location Back    Pain Orientation Left;Mid    Pain Descriptors / Indicators Sore    Pain Onset In the past 7 days                             [facility] Adult PT Treatment/Exercise - 07/05/20 0001      Exercises   Exercises Knee/Hip      Lumbar Exercises: Aerobic   Nustep Level 4 x 10 minutes.      Modalities   Modalities Electrical Stimulation;Moist Heat      Moist Heat Therapy   Number Minutes Moist Heat 20 Minutes    Moist Heat Location Lumbar Spine      Electrical Stimulation   Electrical Stimulation Location Left low back/upper glut.    Electrical Stimulation Action IFC    Electrical Stimulation Parameters 80-150 hz x 20 minutes.       Manual Therapy   Manual Therapy Soft tissue mobilization    Soft tissue mobilization RT sdly position with pillow between knees for comfort:  STW x 13 minutes to patient's left low back, SIJ and upper glut.                       PT Long Term Goals - 06/12/20 1240      PT LONG TERM GOAL #1   Title Independent with a HEP.    Time 6    Period Weeks    Status New      PT LONG TERM GOAL #2   Title Sit 30 minutes with pain not > 2-3/10.    Time 6    Period Weeks    Status New      PT LONG TERM GOAL #3   Title Stand 30 minutes with pain not > 2-3/10.    Time 6    Period Weeks    Status New      PT LONG TERM GOAL #4   Title Eliminate left  LE symptoms.    Time 6    Period Weeks    Status New                 Plan - 07/05/20 1112    Clinical Impression Statement Patient feeling much better.  No reported pain after treatment.    Personal Factors and Comorbidities Comorbidity 1;Comorbidity 2    Comorbidities HTN, Thyroid disease, Cholecystectomy.    Examination-Activity Limitations Bed Mobility;Squat;Lift;Other;Sit;Stand    Examination-Participation Restrictions Other    Stability/Clinical Decision Making Evolving/Moderate complexity    PT Frequency 2x / week    PT Duration 6 weeks    PT Treatment/Interventions ADLs/Self Care Home Management;Cryotherapy;Electrical Stimulation;Ultrasound;Traction;Moist Heat;Iontophoresis 4mg /ml Dexamethasone;Therapeutic activities;Therapeutic exercise;Manual techniques;Passive range of motion;Dry needling;Joint Manipulations;Spinal Manipulations    PT Next Visit Plan Adductor squeeze f/b left SKTC to equalize leg lengths, core exercise progression, combo e'stim/US and STW/M to patient's left SIJ/upper gluteal region.  Consider intermittment traction beginning at 35% body weight.    Consulted and Agree with Plan of Care Patient           Patient will benefit from skilled therapeutic intervention in order to improve the  following deficits and impairments:  Pain, Decreased activity tolerance, Increased muscle spasms, Postural dysfunction  Visit Diagnosis: Acute left-sided low back pain with left-sided sciatica  Abnormal posture     Problem List There are no problems to display for this patient.   Dejon Jungman, MPT 07/05/2020, 11:15 AM  [facility] [address] [place], [place], [place] Phone: [phone]   Fax:  [phone]  Name: SONITA MICHIELS MRN: Honor Loh Date of Birth: 1946/03/20

## 2020-07-11 ENCOUNTER — Ambulatory Visit: Payer: Medicare HMO | Admitting: Physical Therapy

## 2020-07-11 ENCOUNTER — Encounter: Payer: Self-pay | Admitting: Physical Therapy

## 2020-07-11 ENCOUNTER — Other Ambulatory Visit: Payer: Self-pay

## 2020-07-11 DIAGNOSIS — M5442 Lumbago with sciatica, left side: Secondary | ICD-10-CM | POA: Diagnosis not present

## 2020-07-11 DIAGNOSIS — R293 Abnormal posture: Secondary | ICD-10-CM

## 2020-07-11 NOTE — Therapy (Signed)
[facility] [address] [place], [place], [address] Phone: [phone]   Fax:  [phone]  Physical Therapy Treatment  Patient Details  Name: Kristine Carson MRN: [id number] Date of Birth: 03-13-1946 Referring Provider (PT): Roseanna Rainbow NP.   Encounter Date: 07/11/2020   PT End of Session - 07/11/20 0951    Visit Number 5    Number of Visits 12    Date for PT Re-Evaluation 07/24/20    Authorization Type FOTO.    PT Start Time [phone]    PT Stop Time 1031    PT Time Calculation (min) 39 min    Activity Tolerance Patient tolerated treatment well    Behavior During Therapy WFL for tasks assessed/performed           Past Medical History:  Diagnosis Date  . Anxiety   . High cholesterol   . Hypertension   . Thyroid disease     History reviewed. No pertinent surgical history.  There were no vitals filed for this visit.   Subjective Assessment - 07/11/20 0951    Subjective COVID-19 screen performed prior to patient entering clinic.  Much better now. Mopped yesterday and has no pain.    Pertinent History HTN, Thyroid disease, Cholecystectomy.    How long can you sit comfortably? Varies.    How long can you stand comfortably? Varies.    Patient Stated Goals Get out of pain.    Currently in Pain? No/denies              [facility] PT Assessment - 07/11/20 0001      Assessment   Medical Diagnosis Acute left-sided low back pain with left-sided sciatica.    Referring Provider (PT) Roseanna Rainbow NP.      Precautions   Precautions Fall      Restrictions   Weight Bearing Restrictions No                         [facility] Adult PT Treatment/Exercise - 07/11/20 0001      Lumbar Exercises: Supine   Clam 20 reps;2 seconds    Clam Limitations red theraband    Bent Knee Raise 20 reps;3 seconds    Bent Knee Raise Limitations red theraband    Bridge 15 reps;2 seconds    Straight Leg Raise 20 reps;2 seconds    Other Supine Lumbar  Exercises Adductor squeeze x20 reps 5 sec holds      Knee/Hip Exercises: Standing   Heel Raises Both;15 reps    Hip Abduction AROM;Both;20 reps;Knee straight                       PT Long Term Goals - 06/12/20 1240      PT LONG TERM GOAL #1   Title Independent with a HEP.    Time 6    Period Weeks    Status New      PT LONG TERM GOAL #2   Title Sit 30 minutes with pain not > 2-3/10.    Time 6    Period Weeks    Status New      PT LONG TERM GOAL #3   Title Stand 30 minutes with pain not > 2-3/10.    Time 6    Period Weeks    Status New      PT LONG TERM GOAL #4   Title Eliminate left LE symptoms.    Time 6  Period Weeks    Status New                 Plan - 07/11/20 1057    Clinical Impression Statement Patient presented in clinic feeling much better with minimal to no symptoms. Patient guided through therex with no complaints of pain. Patient able to do ADLs without pain as experienced prior to PT. Patient did not report any LBP during therex. No modalities per no pain.    Personal Factors and Comorbidities Comorbidity 1;Comorbidity 2    Comorbidities HTN, Thyroid disease, Cholecystectomy.    Examination-Activity Limitations Bed Mobility;Squat;Lift;Other;Sit;Stand    Examination-Participation Restrictions Other    Stability/Clinical Decision Making Evolving/Moderate complexity    PT Frequency 2x / week    PT Duration 6 weeks    PT Treatment/Interventions ADLs/Self Care Home Management;Cryotherapy;Electrical Stimulation;Ultrasound;Traction;Moist Heat;Iontophoresis 4mg /ml Dexamethasone;Therapeutic activities;Therapeutic exercise;Manual techniques;Passive range of motion;Dry needling;Joint Manipulations;Spinal Manipulations    PT Next Visit Plan Continue with functional core and LE strengthening per POC. NO TRACTION.    Consulted and Agree with Plan of Care Patient           Patient will benefit from skilled therapeutic intervention in order to  improve the following deficits and impairments:  Pain, Decreased activity tolerance, Increased muscle spasms, Postural dysfunction  Visit Diagnosis: Acute left-sided low back pain with left-sided sciatica  Abnormal posture     Problem List There are no problems to display for this patient.   , PTA 07/11/2020, 12:22 PM  [facility] [address] [place], [place], [place] Phone: [phone]   Fax:  [phone]  Name: Kristine Carson MRN: Honor Loh Date of Birth: November 16, 1945

## 2020-07-13 ENCOUNTER — Ambulatory Visit: Payer: Medicare HMO | Admitting: Physical Therapy

## 2020-07-19 ENCOUNTER — Ambulatory Visit: Payer: Medicare HMO | Admitting: Physical Therapy

## 2020-07-19 ENCOUNTER — Encounter: Payer: Self-pay | Admitting: Physical Therapy

## 2020-07-19 DIAGNOSIS — M5442 Lumbago with sciatica, left side: Secondary | ICD-10-CM | POA: Diagnosis not present

## 2020-07-19 DIAGNOSIS — R293 Abnormal posture: Secondary | ICD-10-CM

## 2020-07-19 NOTE — Therapy (Signed)
[facility] [address] [place], [place], [address] Phone: [phone]   Fax:  [phone]  Physical Therapy Treatment  Patient Details  Name: Kristine Carson MRN: [id number] Date of Birth: 03-20-1946 Referring Provider (PT): Roseanna Rainbow NP.   Encounter Date: 07/19/2020   PT End of Session - 07/19/20 0933    Visit Number 6    Number of Visits 12    Date for PT Re-Evaluation 07/24/20    Authorization Type FOTO.    PT Start Time 0900    PT Stop Time 0946    PT Time Calculation (min) 46 min    Activity Tolerance Patient tolerated treatment well    Behavior During Therapy WFL for tasks assessed/performed           Past Medical History:  Diagnosis Date  . Anxiety   . High cholesterol   . Hypertension   . Thyroid disease     No past surgical history on file.  There were no vitals filed for this visit.   Subjective Assessment - 07/19/20 0905    Subjective COVID-19 screen performed prior to patient entering clinic.  Patient reports feeling better with no pain or symptoms down LE.    Pertinent History HTN, Thyroid disease, Cholecystectomy.    How long can you sit comfortably? Varies.    How long can you stand comfortably? Varies.    Patient Stated Goals Get out of pain.    Currently in Pain? No/denies              [facility] PT Assessment - 07/19/20 0001      Assessment   Medical Diagnosis Acute left-sided low back pain with left-sided sciatica.    Referring Provider (PT) Roseanna Rainbow NP.      Precautions   Precautions Fall      Restrictions   Weight Bearing Restrictions No      Observation/Other Assessments   Focus on Therapeutic Outcomes (FOTO)  31% limitation                         [facility] Adult PT Treatment/Exercise - 07/19/20 0001      Exercises   Exercises Knee/Hip      Lumbar Exercises: Stretches   Lower Trunk Rotation 2 reps;30 seconds    Figure 4 Stretch 20 seconds;2 reps;Supine      Lumbar  Exercises: Aerobic   Nustep Level 4 x 10 minutes.      Lumbar Exercises: Seated   Long Arc Quad on Chair Strengthening;Both;2 sets;10 reps    LAQ on Chair Weights (lbs) 2      Lumbar Exercises: Supine   Clam 20 reps;2 seconds    Clam Limitations red theraband    Bent Knee Raise 20 reps;3 seconds    Bent Knee Raise Limitations red theraband    Bridge 20 reps;2 seconds    Straight Leg Raise 20 reps;2 seconds    Other Supine Lumbar Exercises Adductor squeeze x20 reps 5 sec holds      Knee/Hip Exercises: Standing   Functional Squat 1 set;10 reps;3 seconds    Rocker Board 3 minutes                       PT Long Term Goals - 07/19/20 0941      PT LONG TERM GOAL #1   Title Independent with a HEP.    Period Weeks    Status Achieved  PT LONG TERM GOAL #2   Title Sit 30 minutes with pain not > 2-3/10.    Time 6    Period Weeks    Status Achieved      PT LONG TERM GOAL #3   Title Stand 30 minutes with pain not > 2-3/10.    Time 6    Period Weeks    Status Achieved      PT LONG TERM GOAL #4   Title Eliminate left LE symptoms.    Time 6    Period Weeks    Status On-going                 Plan - 07/19/20 8921    Clinical Impression Statement Patient responded well to therapy session with the addition of TEs. Patient guided through TEs to which patient demonstrated good form after explanation. Patient is making good progress towards goals but still has intermitent symptoms to left LE.    Personal Factors and Comorbidities Comorbidity 1;Comorbidity 2    Comorbidities HTN, Thyroid disease, Cholecystectomy.    Examination-Activity Limitations Bed Mobility;Squat;Lift;Other;Sit;Stand    Examination-Participation Restrictions Other    Stability/Clinical Decision Making Evolving/Moderate complexity    Clinical Decision Making Low    PT Frequency 2x / week    PT Duration 6 weeks    PT Treatment/Interventions ADLs/Self Care Home  Management;Cryotherapy;Electrical Stimulation;Ultrasound;Traction;Moist Heat;Iontophoresis 4mg /ml Dexamethasone;Therapeutic activities;Therapeutic exercise;Manual techniques;Passive range of motion;Dry needling;Joint Manipulations;Spinal Manipulations    PT Next Visit Plan Continue with functional core and LE strengthening per POC. NO TRACTION.    Consulted and Agree with Plan of Care Patient           Patient will benefit from skilled therapeutic intervention in order to improve the following deficits and impairments:  Pain, Decreased activity tolerance, Increased muscle spasms, Postural dysfunction  Visit Diagnosis: Acute left-sided low back pain with left-sided sciatica  Abnormal posture     Problem List There are no problems to display for this patient.   , PT, DPT 07/19/2020, 9:59 AM  [facility] [address] [place], [place], [place] Phone: [phone]   Fax:  [phone]  Name: Kristine Carson MRN: Honor Loh Date of Birth: 13-Mar-1946

## 2020-07-21 ENCOUNTER — Encounter: Payer: Self-pay | Admitting: Physical Therapy

## 2020-07-21 ENCOUNTER — Ambulatory Visit: Payer: Medicare HMO | Admitting: Physical Therapy

## 2020-07-21 ENCOUNTER — Other Ambulatory Visit: Payer: Self-pay

## 2020-07-21 DIAGNOSIS — M5442 Lumbago with sciatica, left side: Secondary | ICD-10-CM

## 2020-07-21 DIAGNOSIS — R293 Abnormal posture: Secondary | ICD-10-CM

## 2020-07-21 NOTE — Therapy (Signed)
[facility] [address] [place], [place], [address] Phone: [phone]   Fax:  [phone]  Physical Therapy Treatment  Patient Details  Name: Kristine Carson MRN: [id number] Date of Birth: [birth year] Referring Provider (PT): Roseanna Rainbow NP.   Encounter Date: 07/21/2020   PT End of Session - 07/21/20 0814    Visit Number 7    Number of Visits 12    Date for PT Re-Evaluation 07/24/20    Authorization Type FOTO.    PT Start Time 0815    PT Stop Time 0900    PT Time Calculation (min) 45 min    Activity Tolerance Patient tolerated treatment well    Behavior During Therapy WFL for tasks assessed/performed           Past Medical History:  Diagnosis Date  . Anxiety   . High cholesterol   . Hypertension   . Thyroid disease     History reviewed. No pertinent surgical history.  There were no vitals filed for this visit.   Subjective Assessment - 07/21/20 0814    Subjective COVID-19 screen performed prior to patient entering clinic. Patient reports she feels good but upon waking she had some pain with trunk flexion. Patient walked approximately one mile yesterday and had some discomfort afterwards as well.    Pertinent History HTN, Thyroid disease, Cholecystectomy.    How long can you sit comfortably? Varies.    How long can you stand comfortably? Varies.    Patient Stated Goals Get out of pain.    Currently in Pain? No/denies              [facility] PT Assessment - 07/21/20 0001      Assessment   Medical Diagnosis Acute left-sided low back pain with left-sided sciatica.    Referring Provider (PT) Roseanna Rainbow NP.      Precautions   Precautions Fall      Restrictions   Weight Bearing Restrictions No                         [facility] Adult PT Treatment/Exercise - 07/21/20 0001      Lumbar Exercises: Aerobic   Nustep L4 x17 min      Lumbar Exercises: Standing   Shoulder Extension Strengthening;Both;20  reps;Limitations    Shoulder Extension Limitations Blue XTS    Other Standing Lumbar Exercises B chop/lift blue XTS x      Lumbar Exercises: Supine   Bridge 20 reps;3 seconds    Bridge Limitations red theraband    Straight Leg Raise 20 reps;2 seconds      Lumbar Exercises: Sidelying   Clam Both;20 reps;3 seconds    Clam Limitations red theraband                       PT Long Term Goals - 07/19/20 0941      PT LONG TERM GOAL #1   Title Independent with a HEP.    Period Weeks    Status Achieved      PT LONG TERM GOAL #2   Title Sit 30 minutes with pain not > 2-3/10.    Time 6    Period Weeks    Status Achieved      PT LONG TERM GOAL #3   Title Stand 30 minutes with pain not > 2-3/10.    Time 6    Period Weeks    Status Achieved  PT LONG TERM GOAL #4   Title Eliminate left LE symptoms.    Time 6    Period Weeks    Status On-going                 Plan - 07/21/20 0915    Clinical Impression Statement Patient presented in clinic with reports of some discomfort upon waking as well as after walking one mile. Prior to onset of LBP patient stated she normally walked two miles. Patient progressed to more functional standing postural strengthening. No complaints of any increased LBP during treatment today and none at end of treatment.    Personal Factors and Comorbidities Comorbidity 1;Comorbidity 2    Comorbidities HTN, Thyroid disease, Cholecystectomy.    Examination-Activity Limitations Bed Mobility;Squat;Lift;Other;Sit;Stand    Examination-Participation Restrictions Other    Stability/Clinical Decision Making Evolving/Moderate complexity    PT Frequency 2x / week    PT Duration 6 weeks    PT Treatment/Interventions ADLs/Self Care Home Management;Cryotherapy;Electrical Stimulation;Ultrasound;Traction;Moist Heat;Iontophoresis 4mg /ml Dexamethasone;Therapeutic activities;Therapeutic exercise;Manual techniques;Passive range of motion;Dry needling;Joint  Manipulations;Spinal Manipulations    PT Next Visit Plan Continue with functional core and LE strengthening per POC. NO TRACTION.    Consulted and Agree with Plan of Care Patient           Patient will benefit from skilled therapeutic intervention in order to improve the following deficits and impairments:  Pain, Decreased activity tolerance, Increased muscle spasms, Postural dysfunction  Visit Diagnosis: Acute left-sided low back pain with left-sided sciatica  Abnormal posture     Problem List There are no problems to display for this patient.   , PTA 07/21/2020, 9:19 AM  [facility] [address] [place], [place], [place] Phone: [phone]   Fax:  [phone]  Name: Kristine Carson MRN: Honor Loh Date of Birth: 1946/04/25

## 2020-07-25 ENCOUNTER — Encounter: Payer: Self-pay | Admitting: Physical Therapy

## 2020-07-25 ENCOUNTER — Ambulatory Visit: Payer: Medicare HMO | Admitting: Physical Therapy

## 2020-07-25 ENCOUNTER — Other Ambulatory Visit: Payer: Self-pay

## 2020-07-25 DIAGNOSIS — M5442 Lumbago with sciatica, left side: Secondary | ICD-10-CM | POA: Diagnosis not present

## 2020-07-25 DIAGNOSIS — R293 Abnormal posture: Secondary | ICD-10-CM

## 2020-07-25 NOTE — Therapy (Signed)
[facility] [address] [place], [place], [address] Phone: [phone]   Fax:  [phone]  Physical Therapy Treatment  Patient Details  Name: Kristine Carson MRN: [id number] Date of Birth: 1945/11/19 Referring Provider (PT): Roseanna Rainbow NP.   Encounter Date: 07/25/2020   PT End of Session - 07/25/20 0946    Visit Number 8    Number of Visits 12    Date for PT Re-Evaluation 07/24/20    Authorization Type FOTO.    PT Start Time 0912    PT Stop Time 0956    PT Time Calculation (min) 44 min    Activity Tolerance Patient tolerated treatment well    Behavior During Therapy [facility] for tasks assessed/performed           Past Medical History:  Diagnosis Date  . Anxiety   . High cholesterol   . Hypertension   . Thyroid disease     History reviewed. No pertinent surgical history.  There were no vitals filed for this visit.   Subjective Assessment - 07/25/20 1014    Subjective COVID-19 screening performed upon arrival. Patient arrived to with more L hamstring pain which may be due to sitting in the recliner yesterday.    Pertinent History HTN, Thyroid disease, Cholecystectomy.    How long can you sit comfortably? Varies.    How long can you stand comfortably? Varies.    Patient Stated Goals Get out of pain.    Currently in Pain? Yes   did not provide number on pain scale             [facility] PT Assessment - 07/25/20 0001      Assessment   Medical Diagnosis Acute left-sided low back pain with left-sided sciatica.    Referring Provider (PT) Roseanna Rainbow NP.      Precautions   Precautions [facility] Adult PT Treatment/Exercise - 07/25/20 0001      Exercises   Exercises Knee/Hip      Lumbar Exercises: Stretches   Passive Hamstring Stretch 2 reps;30 seconds;Right;Left    Passive Hamstring Stretch Limitations seated    Figure 4 Stretch 3 reps;30 seconds;Seated;Without overpressure      Lumbar  Exercises: Aerobic   Nustep L4 x15 min      Knee/Hip Exercises: Standing   Rocker Board 3 minutes      Knee/Hip Exercises: Seated   Long Arc Quad Strengthening;Both;3 sets;10 reps    Long Arc Quad Weight 2 lbs.    Hamstring Curl Strengthening;Both;3 sets;10 reps    Hamstring Limitations red theraband                       PT Long Term Goals - 07/19/20 0941      PT LONG TERM GOAL #1   Title Independent with a HEP.    Period Weeks    Status Achieved      PT LONG TERM GOAL #2   Title Sit 30 minutes with pain not > 2-3/10.    Time 6    Period Weeks    Status Achieved      PT LONG TERM GOAL #3   Title Stand 30 minutes with pain not > 2-3/10.    Time 6    Period Weeks    Status Achieved      PT  LONG TERM GOAL #4   Title Eliminate left LE symptoms.    Time 6    Period Weeks    Status On-going                 Plan - 07/25/20 0946    Clinical Impression Statement Patient responded well to therapy session but with reports of left hamstring discomfort. Patient guided through exercises and stretching with minimal complaints. Slow controlled eccentric hamstring curls performed with good technique. Patient and PT discussed adding hamstring stretch in sitting to HEP to which patient reported understanding. Patient reported ongoing discomfort in L hamstring at end of session but stated she typically feels better once she gets home.    Personal Factors and Comorbidities Comorbidity 1;Comorbidity 2    Comorbidities HTN, Thyroid disease, Cholecystectomy.    Examination-Activity Limitations Bed Mobility;Squat;Lift;Other;Sit;Stand    Examination-Participation Restrictions Other    Stability/Clinical Decision Making Evolving/Moderate complexity    Clinical Decision Making Low    PT Frequency 2x / week    PT Duration 6 weeks    PT Treatment/Interventions ADLs/Self Care Home Management;Cryotherapy;Electrical Stimulation;Ultrasound;Traction;Moist Heat;Iontophoresis  4mg /ml Dexamethasone;Therapeutic activities;Therapeutic exercise;Manual techniques;Passive range of motion;Dry needling;Joint Manipulations;Spinal Manipulations    PT Next Visit Plan Continue with functional core and LE strengthening per POC. NO TRACTION.    Consulted and Agree with Plan of Care Patient           Patient will benefit from skilled therapeutic intervention in order to improve the following deficits and impairments:  Pain, Decreased activity tolerance, Increased muscle spasms, Postural dysfunction  Visit Diagnosis: Acute left-sided low back pain with left-sided sciatica  Abnormal posture     Problem List There are no problems to display for this patient.   , PT, DPT 07/25/2020, 10:16 AM  [facility] [address] [place], [place], [place] Phone: [phone]   Fax:  [phone]  Name: Kristine Carson MRN: Honor Loh Date of Birth: 1946/05/01

## 2020-07-27 ENCOUNTER — Ambulatory Visit: Payer: Medicare HMO | Admitting: Physical Therapy

## 2020-07-27 ENCOUNTER — Encounter: Payer: Self-pay | Admitting: Physical Therapy

## 2020-07-27 ENCOUNTER — Other Ambulatory Visit: Payer: Self-pay

## 2020-07-27 DIAGNOSIS — R293 Abnormal posture: Secondary | ICD-10-CM

## 2020-07-27 DIAGNOSIS — M5442 Lumbago with sciatica, left side: Secondary | ICD-10-CM | POA: Diagnosis not present

## 2020-07-27 NOTE — Therapy (Signed)
[facility] [address] [place], [place], [address] Phone: [phone]   Fax:  [phone]  Physical Therapy Treatment  Patient Details  Name: Kristine Carson MRN: [id number] Date of Birth: 1946/10/11 Referring Provider (PT): Roseanna Rainbow NP.   Encounter Date: 07/27/2020   PT End of Session - 07/27/20 0745    Visit Number 9    Number of Visits 12    Date for PT Re-Evaluation 07/24/20    Authorization Type FOTO.    PT Start Time 0732    PT Stop Time 0815    PT Time Calculation (min) 43 min    Activity Tolerance Patient tolerated treatment well    Behavior During Therapy WFL for tasks assessed/performed           Past Medical History:  Diagnosis Date  . Anxiety   . High cholesterol   . Hypertension   . Thyroid disease     History reviewed. No pertinent surgical history.  There were no vitals filed for this visit.   Subjective Assessment - 07/27/20 0744    Subjective COVID-19 screening performed upon arrival. Patient reported feeling more discomfort last night. States she was out two days in a row. Currently states pain is "not bad."    Pertinent History HTN, Thyroid disease, Cholecystectomy.    How long can you sit comfortably? Varies.    How long can you stand comfortably? Varies.    Patient Stated Goals Get out of pain.    Currently in Pain? No/denies              [facility] PT Assessment - 07/27/20 0001      Assessment   Medical Diagnosis Acute left-sided low back pain with left-sided sciatica.    Referring Provider (PT) Roseanna Rainbow NP.      Precautions   Precautions Fall                         [facility] Adult PT Treatment/Exercise - 07/27/20 0001      Lumbar Exercises: Aerobic   Nustep L4 x15 min      Lumbar Exercises: Standing   Shoulder Extension Strengthening;Both;20 reps;Limitations    Shoulder Extension Limitations Blue XTS    Other Standing Lumbar Exercises B chop/lift blue XTS x20      Lumbar  Exercises: Supine   Bridge 20 reps;3 seconds    Bridge Limitations red theraband    Straight Leg Raise 20 reps;2 seconds      Knee/Hip Exercises: Standing   Rocker Board 3 minutes                       PT Long Term Goals - 07/19/20 0941      PT LONG TERM GOAL #1   Title Independent with a HEP.    Period Weeks    Status Achieved      PT LONG TERM GOAL #2   Title Sit 30 minutes with pain not > 2-3/10.    Time 6    Period Weeks    Status Achieved      PT LONG TERM GOAL #3   Title Stand 30 minutes with pain not > 2-3/10.    Time 6    Period Weeks    Status Achieved      PT LONG TERM GOAL #4   Title Eliminate left LE symptoms.    Time 6    Period Weeks  Status On-going                 Plan - 07/27/20 0754    Clinical Impression Statement Patient arrives to therapy with no reports of pain currently but still has pain in the evenings. Patient guided through standing and supine exercises requiring intermittent cuing for pacing of exercises and technique. Patient demonstrated improved form for remaining reps. Patient educated to pace activities at home and rest as needed to limit pain later in the evening.    Personal Factors and Comorbidities Comorbidity 1;Comorbidity 2    Comorbidities HTN, Thyroid disease, Cholecystectomy.    Examination-Activity Limitations Bed Mobility;Squat;Lift;Other;Sit;Stand    Examination-Participation Restrictions Other    Stability/Clinical Decision Making Evolving/Moderate complexity    Clinical Decision Making Low    PT Frequency 2x / week    PT Duration 6 weeks    PT Treatment/Interventions ADLs/Self Care Home Management;Cryotherapy;Electrical Stimulation;Ultrasound;Traction;Moist Heat;Iontophoresis 4mg /ml Dexamethasone;Therapeutic activities;Therapeutic exercise;Manual techniques;Passive range of motion;Dry needling;Joint Manipulations;Spinal Manipulations    PT Next Visit Plan Progress note and FOTO next visit; Continue  with functional core and LE strengthening per POC. NO TRACTION.    Consulted and Agree with Plan of Care Patient           Patient will benefit from skilled therapeutic intervention in order to improve the following deficits and impairments:  Pain, Decreased activity tolerance, Increased muscle spasms, Postural dysfunction  Visit Diagnosis: Acute left-sided low back pain with left-sided sciatica  Abnormal posture     Problem List There are no problems to display for this patient.   , PT, DPT 07/27/2020, 8:16 AM  [facility] [address] [place], [place], [place] Phone: [phone]   Fax:  [phone]  Name: Kristine Carson MRN: Honor Loh Date of Birth: June 11, 1946

## 2020-07-31 ENCOUNTER — Other Ambulatory Visit: Payer: Self-pay

## 2020-07-31 ENCOUNTER — Ambulatory Visit: Payer: Medicare HMO | Attending: Nurse Practitioner | Admitting: Physical Therapy

## 2020-07-31 DIAGNOSIS — R293 Abnormal posture: Secondary | ICD-10-CM | POA: Diagnosis present

## 2020-07-31 DIAGNOSIS — M5442 Lumbago with sciatica, left side: Secondary | ICD-10-CM | POA: Diagnosis not present

## 2020-07-31 NOTE — Therapy (Signed)
[facility] [place], [place], [address] Phone: [phone]   Fax:  [phone]  Physical Therapy Treatment PHYSICAL THERAPY DISCHARGE SUMMARY  Visits from Start of Care: 10  Current functional level related to goals / functional outcomes: See below   Remaining deficits: See goals   Education / Equipment: HEP Plan: Patient agrees to discharge.  Patient goals were met. Patient is being discharged due to meeting the stated rehab goals.  ?????      Patient Details  Name: Kristine Carson MRN: [id number] Date of Birth: [birth year] Referring Provider (PT): Elfredia Nevins NP.   Encounter Date: 07/31/2020   PT End of Session - 07/31/20 0910    Visit Number 10    Number of Visits 12    Date for PT Re-Evaluation 07/24/20    Authorization Type FOTO.    PT Start Time 0900    PT Stop Time 0930   requested 30 min treatment   PT Time Calculation (min) 30 min    Activity Tolerance Patient tolerated treatment well    Behavior During Therapy WFL for tasks assessed/performed           Past Medical History:  Diagnosis Date  . Anxiety   . High cholesterol   . Hypertension   . Thyroid disease     No past surgical history on file.  There were no vitals filed for this visit.   Subjective Assessment - 07/31/20 0913    Subjective COVID-19 screening performed upon arrival. [place] and did not have any pain duing the ride. Requests 30 min treatment session today.    Pertinent History HTN, Thyroid disease, Cholecystectomy.    How long can you sit comfortably? Varies.    How long can you stand comfortably? Varies.    Patient Stated Goals Get out of pain.    Currently in Pain? No/denies              [facility] PT Assessment - 07/31/20 0001      Assessment   Medical Diagnosis Acute left-sided low back pain with left-sided sciatica.    Referring Provider (PT) Elfredia Nevins NP.       Precautions   Precautions Fall                         [facility] Adult PT Treatment/Exercise - 07/31/20 0001      Lumbar Exercises: Aerobic   Nustep L4 x10 min      Lumbar Exercises: Standing   Functional Squats 20 reps;2 seconds    Row Strengthening;Both;20 reps;Theraband    Theraband Level (Row) Level 2 (Red)      Knee/Hip Exercises: Standing   Hip Abduction AROM;Both;20 reps;Knee straight    Hip Extension AROM;Both;20 reps;Knee straight    Rocker Board 3 minutes      Knee/Hip Exercises: Seated   Long Arc Quad Strengthening;Both;3 sets;10 reps    Long Arc Quad Weight 2 lbs.                       PT Long Term Goals - 07/31/20 0941      PT LONG TERM GOAL #1   Title Independent with a HEP.    Time 6    Period Weeks    Status Achieved      PT LONG TERM GOAL #2   Title Sit 30 minutes with pain not > 2-3/10.  Time 6    Period Weeks    Status Achieved      PT LONG TERM GOAL #3   Title Stand 30 minutes with pain not > 2-3/10.    Time 6    Period Weeks    Status Achieved      PT LONG TERM GOAL #4   Title Eliminate left LE symptoms.    Time 6    Period Weeks    Status Achieved                 Plan - 07/31/20 8099    Clinical Impression Statement Patient responded well to therapy session but required intermittent cuing for form and technique. Patient improved with form after cuing. Patient reported an elimination of  LE symptoms therefore achieving remaining goal. Patient provided with new HEP and discussed continuing to maintain gains in PT. DC today with emphasis on HEP.    Personal Factors and Comorbidities Comorbidity 1;Comorbidity 2    Comorbidities HTN, Thyroid disease, Cholecystectomy.    Examination-Activity Limitations Bed Mobility;Squat;Lift;Other;Sit;Stand    Examination-Participation Restrictions Other    Stability/Clinical Decision Making Evolving/Moderate complexity    Clinical Decision Making Low    PT Frequency 2x /  week    PT Duration 6 weeks    PT Treatment/Interventions ADLs/Self Care Home Management;Cryotherapy;Electrical Stimulation;Ultrasound;Traction;Moist Heat;Iontophoresis 45m/ml Dexamethasone;Therapeutic activities;Therapeutic exercise;Manual techniques;Passive range of motion;Dry needling;Joint Manipulations;Spinal Manipulations    PT Next Visit Plan DC    Consulted and Agree with Plan of Care Patient           Patient will benefit from skilled therapeutic intervention in order to improve the following deficits and impairments:  Pain, Decreased activity tolerance, Increased muscle spasms, Postural dysfunction  Visit Diagnosis: Acute left-sided low back pain with left-sided sciatica  Abnormal posture     Problem List There are no problems to display for this patient.   KGabriela Eves PT, DPT 07/31/2020, 10:30 AM  [facility] [place] [address]Phone: [phone]  Fax:  [phone] Name: Kristine PAKULAMRN: [id number] of Birth: [birth year]

## 2020-08-02 ENCOUNTER — Encounter: Payer: Self-pay | Admitting: Physical Therapy

## 2020-08-04 ENCOUNTER — Encounter: Payer: Self-pay | Admitting: Physical Therapy

## 2020-08-07 ENCOUNTER — Encounter: Payer: Self-pay | Admitting: Physical Therapy

## 2021-04-09 ENCOUNTER — Other Ambulatory Visit: Payer: Self-pay | Admitting: Physician Assistant

## 2021-04-09 DIAGNOSIS — Z1231 Encounter for screening mammogram for malignant neoplasm of breast: Secondary | ICD-10-CM

## 2021-06-04 ENCOUNTER — Other Ambulatory Visit: Payer: Self-pay

## 2021-06-04 ENCOUNTER — Ambulatory Visit
Admission: RE | Admit: 2021-06-04 | Discharge: 2021-06-04 | Disposition: A | Discharge status: Home / Self Care | Payer: Medicare HMO | Source: Ambulatory Visit | Attending: Physician Assistant | Admitting: Physician Assistant

## 2021-06-04 DIAGNOSIS — Z1231 Encounter for screening mammogram for malignant neoplasm of breast: Secondary | ICD-10-CM

## 2021-10-04 ENCOUNTER — Other Ambulatory Visit: Payer: Self-pay | Admitting: Physician Assistant

## 2021-10-04 DIAGNOSIS — E2839 Other primary ovarian failure: Secondary | ICD-10-CM

## 2022-03-08 ENCOUNTER — Ambulatory Visit
Admission: RE | Admit: 2022-03-08 | Discharge: 2022-03-08 | Disposition: A | Discharge status: Home / Self Care | Payer: Medicare HMO | Source: Ambulatory Visit | Attending: Physician Assistant | Admitting: Physician Assistant

## 2022-03-08 DIAGNOSIS — E2839 Other primary ovarian failure: Secondary | ICD-10-CM

## 2022-03-11 ENCOUNTER — Ambulatory Visit: Payer: Medicare HMO | Attending: Physician Assistant

## 2022-03-11 DIAGNOSIS — Z9181 History of falling: Secondary | ICD-10-CM | POA: Insufficient documentation

## 2022-03-11 NOTE — Therapy (Signed)
?[facility] ?[place], [place], [address] ?Phone: [phone]   Fax:  [phone] ? ?Physical Therapy Evaluation ? ?Patient Details  ?Name: Kristine Carson ?MRN: [id number] ?Date of Birth: [birth year] ?Referring Provider (PT): Frederico Hamman, [place] ? ? ?Encounter Date: 03/11/2022 ? ? PT End of Session - 03/11/22 0956   ? ? Visit Number 1   ? Number of Visits 2   ? Date for PT Re-Evaluation 03/29/22   ? PT Start Time [phone]   Patient arrived late to her appointment.  ? PT Stop Time 1030   ? PT Time Calculation (min) 32 min   ? Activity Tolerance Patient tolerated treatment well   ? Behavior During Therapy [facility] for tasks assessed/performed   ? ?  ?  ? ?  ? ? ?Past Medical History:  ?Diagnosis Date  ? Anxiety   ? High cholesterol   ? Hypertension   ? Thyroid disease   ? ? ?History reviewed. No pertinent surgical history. ? ?There were no vitals filed for this visit. ? ? ? Subjective Assessment - 03/11/22 0957   ? ? Subjective Patient was referred to physical therapy due to her falls. However, she does not believe that she needs therapy. Her daughter notes that she has tripped multiple times over the past few months. She has begun to walk about a mile per day which she notes that this feels good. She notes that if she squats without holding on to anything then she will fall.   ? Patient is accompained by: Family member   ? Pertinent History dementia   ? Patient Stated Goals be provided an HEP   ? Currently in Pain? No/denies   ? ?  ?  ? ?  ? ? ? ? ? [facility] PT Assessment - 03/11/22 0001   ? ?  ? Assessment  ? Medical Diagnosis Fear of falling   ? Referring Provider (PT) Frederico Hamman, PA-C   ? Next MD Visit None scheduled   ? Prior Therapy No   ?  ? Precautions  ? Precautions Fall   ?  ? Restrictions  ? Weight Bearing Restrictions No   ?  ? Balance Screen  ? Has the patient fallen in the past 6 months Yes   ? How many times? 3   trying to get into her car,  ? Has the patient had a  decrease in activity level because of a fear of falling?  No   ? Is the patient reluctant to leave their home because of a fear of falling?  No   ?  ? Home Environment  ? Living Environment Private residence   ? Home Access Stairs to enter   ?  ? Prior Function  ? Level of Independence Independent   ?  ? Cognition  ? Overall Cognitive Status Within Functional Limits for tasks assessed   ?  ? ROM / Strength  ? AROM / PROM / Strength Strength   ?  ? Strength  ? Strength Assessment Site Hip;Knee;Ankle   ? Right/Left Hip Right;Left   ? Right Hip Flexion 4-/5   ? Left Hip Flexion 4/5   ? Right/Left Knee Right;Left   ? Right Knee Flexion 4+/5   ? Right Knee Extension 5/5   ? Left Knee Flexion 5/5   ? Left Knee Extension 5/5   ? Right/Left Ankle Right;Left   ? Right Ankle Dorsiflexion 4/5   ? Left Ankle Dorsiflexion 4/5   ?  ?  Transfers  ? Transfers Sit to Stand;Stand to Sit   ? Sit to Stand Without upper extremity assist   ? Five time sit to stand comments  17 seconds   ? Stand to Sit Without upper extremity assist   ?  ? Balance  ? Balance Assessed Yes   ?  ? Static Standing Balance  ? Static Standing Balance -  Activities  Tandam Stance - Right Leg;Tandam Stance - Left Leg;Romberg - Eyes Opened;Romberg - Eyes Closed   ? Static Standing - Comment/# of Minutes Rhomberg (EO and EC): 30 seconds each; Tandem: 30 seconds with LLE leading; 20 seconds with RLE leading   ?  ? Standardized Balance Assessment  ? Standardized Balance Assessment Timed Up and Go Test   ?  ? Timed Up and Go Test  ? TUG Normal TUG   ? Normal TUG (seconds) 12   ? TUG Comments minimal cueing throughout test for proper performance   ? ?  ?  ? ?  ? ? ? ? ? ? ? ? ? ? ? ? ? ?Objective measurements completed on examination: See above findings.  ? ? ? ? ? [facility] Adult PT Treatment/Exercise - 03/11/22 0001   ? ?  ? Exercises  ? Exercises Knee/Hip   ?  ? Knee/Hip Exercises: Seated  ? Other Seated Knee/Hip Exercises Heel/ toe raises   20 reps each  ? ?  ?  ? ?   ? ? ? ? ? ? ? ? ? ? ? ? ? ? ? PT Long Term Goals - 03/11/22 1518   ? ?  ? PT LONG TERM GOAL #1  ? Title Patient will be independent with her HEP.   ? Time 1   ? Period Weeks   ? Status New   ? Target Date 03/18/22   ? ?  ?  ? ?  ? ? ? ? ? ? ? ? ? Plan - 03/11/22 1327   ? ? Clinical Impression Statement Patient is a 76 year old female presenting to physical therapy with lower extremity weakness and a history of falling. She exhibited an elevated five time sit to stand time which places her at an elevated fall risk. This along with her history of falling place her at a high fall risk. She and her daughter wish to be provided an extensive HEP and wish to attempt this for a few weeks before deciding if they want to continue with skilled physical therapy. This HEP will be provided at her next appointment as she was late to her appointment today.   ? Personal Factors and Comorbidities Other;Transportation;Time since onset of injury/illness/exacerbation   ? Examination-Activity Limitations Locomotion Level;Transfers   ? Examination-Participation Restrictions Cleaning;Community Activity   ? Stability/Clinical Decision Making Stable/Uncomplicated   ? Clinical Decision Making Low   ? Rehab Potential Good   ? PT Frequency 1x / week   ? PT Duration 2 weeks   ? PT Treatment/Interventions Neuromuscular re-education;Balance training;Therapeutic exercise;Therapeutic activities;Functional mobility training;Patient/family education   ? PT Next Visit Plan provide HEP to include LAQ, sit to stands, heel/toe raises (seated and standing), hamstring curls, and others as appropriate   ? Consulted and Agree with Plan of Care Patient;Family member/caregiver   ? Family Member Consulted daughter   ? ?  ?  ? ?  ? ? ?Patient will benefit from skilled therapeutic intervention in order to improve the following deficits and impairments:  Difficulty walking, Decreased balance, Decreased strength ? ?Visit  Diagnosis: ?History of  falling ? ? ? ? ?Problem List ?There are no problems to display for this patient. ? ? ?Darlin Coco, PT ?03/11/2022, 3:19 PM ? ?[place] ?[facility] ?[address] ?[place], [place], [address] ?Phone: [phone]   Fax:  [phone] ? ?Name: FALAN BLADE ?MRN: [id number] ?Date of Birth: 1946/05/04 ? ? ?

## 2022-03-26 ENCOUNTER — Ambulatory Visit: Payer: Medicare HMO

## 2022-03-26 DIAGNOSIS — Z9181 History of falling: Secondary | ICD-10-CM

## 2022-03-26 NOTE — Therapy (Addendum)
[name], [place], [address] Phone: [phone]   Fax:  [phone]  Physical Therapy Treatment  Patient Details  Name: Kristine Carson MRN: [id number] Date of Birth: 1946/03/03 Referring Provider (PT): Frederico Hamman, [place]   Encounter Date: 03/26/2022   PT End of Session - 03/26/22 0957     Visit Number 2    Number of Visits 2    Date for PT Re-Evaluation 03/29/22    PT Start Time 0945    PT Stop Time 1031    PT Time Calculation (min) 46 min    Activity Tolerance Patient tolerated treatment well    Behavior During Therapy [facility] for tasks assessed/performed             Past Medical History:  Diagnosis Date   Anxiety    High cholesterol    Hypertension    Thyroid disease     History reviewed. No pertinent surgical history.  There were no vitals filed for this visit.   Subjective Assessment - 03/26/22 0951     Subjective Patient reports that she feels alright today.    Patient is accompained by: Family member    Pertinent History dementia    Patient Stated Goals be provided an HEP                               [facility] Adult PT Treatment/Exercise - 03/26/22 0001       Knee/Hip Exercises: Aerobic   Nustep L4 x 11 minutes      Knee/Hip Exercises: Standing   Heel Raises Both   alternating with toe raises; 2 minutes   Hip Abduction Both;20 reps;Knee straight   with UE support   Hip Extension Both;20 reps;Knee straight   with UE support   Functional Squat 20 reps   at counter wiht UE support   Other Standing Knee Exercises Tandem stance with UE support   3 x 30 seconds each     Knee/Hip Exercises: Seated   Long Arc Quad Both;20 reps   green t-band at ankles   Clamshell with TheraBand Green   30 reps                         PT Long Term Goals - 03/11/22 1518       PT LONG TERM GOAL #1   Title Patient will be independent with her HEP.    Time 1    Period  Weeks    Status New    Target Date 03/18/22                   Plan - 03/26/22 0957     Clinical Impression Statement Patient was introduced to multiple new interventions for improved lower extremity strength and stability. She required minimal cueing with standing toe raises and other new interventions for proper exercise performance. She was able to safely complete all of today's interventions. These interventions were added to her HEP and she reported feeling comfortable with these interventions. She requested to be placed on hold to determine the effectiveness of her HEP.    Personal Factors and Comorbidities Other;Transportation;Time since onset of injury/illness/exacerbation    Examination-Activity Limitations Locomotion Level;Transfers    Examination-Participation Restrictions Cleaning;Community Activity    Stability/Clinical Decision Making Stable/Uncomplicated    Rehab Potential Good    PT Frequency 1x / week    PT  Duration 2 weeks    PT Treatment/Interventions Neuromuscular re-education;Balance training;Therapeutic exercise;Therapeutic activities;Functional mobility training;Patient/family education    PT Next Visit Plan provide HEP to include LAQ, sit to stands, heel/toe raises (seated and standing), hamstring curls, and others as appropriate    PT Home Exercise Plan Access Code: [email or web address]  URL: [email or web address]  Date: 03/26/2022  Prepared by: Jacqulynn Cadet    Exercises  - Supine Single Knee to Chest Stretch  - 2 x daily - 7 x weekly - 3 sets - 30 seconds  hold  - Heel Toe Raises with Counter Support  - 2 x daily - 7 x weekly - 3 sets - 10 reps  - Backward Step Down with Counter Support  - 2 x daily - 7 x weekly - 3 sets - 10 reps  - Sit to Stand  - 2 x daily - 7 x weekly - 3 sets - 10 reps  - Squat with Counter Support  - 2 x daily - 7 x weekly - 3 sets - 10 reps  - Standing Clamshell with Resistance  - 2 x daily - 7 x weekly - 3 sets - 10 reps  - Sitting Knee  Extension with Resistance  - 2 x daily - 7 x weekly - 3 sets - 10 reps  - Standing Tandem Balance with Counter Support  - 2 x daily - 7 x weekly - 3 sets - 30 seconds  hold    Consulted and Agree with Plan of Care Patient;Family member/caregiver    Family Member Consulted daughter             Patient will benefit from skilled therapeutic intervention in order to improve the following deficits and impairments:  Difficulty walking, Decreased balance, Decreased strength  Visit Diagnosis: History of falling     Problem List There are no problems to display for this patient.   Darlin Coco, PT 03/26/2022, 12:47 PM  [facility] [place], [place], [address] Phone: [phone]   Fax:  [phone]  Name: Kristine Carson MRN: [id number] Date of Birth: 1945-10-30  PHYSICAL THERAPY DISCHARGE SUMMARY  Visits from Start of Care: 2  Current functional level related to goals / functional outcomes: Patient was provided a HEP and felt comfortable with these interventions.    Remaining deficits: Lower extremity weakness and instability   Education / Equipment: HEP    Patient agrees to discharge. Patient goals were met. Patient is being discharged due to the patient's request.  Jacqulynn Cadet, PT, DPT

## 2022-06-04 ENCOUNTER — Other Ambulatory Visit: Payer: Self-pay | Admitting: Physician Assistant

## 2022-06-04 DIAGNOSIS — Z1231 Encounter for screening mammogram for malignant neoplasm of breast: Secondary | ICD-10-CM

## 2022-06-13 ENCOUNTER — Ambulatory Visit: Payer: Medicare HMO

## 2022-10-10 ENCOUNTER — Ambulatory Visit: Payer: Medicare HMO

## 2022-12-21 IMAGING — MG MM DIGITAL SCREENING BILAT W/ TOMO AND CAD
8 series · 8 of 24 positions shown · non-contrast
Comparison: Previous exam(s).

CLINICAL DATA: Screening.

EXAM:
DIGITAL SCREENING BILATERAL MAMMOGRAM WITH TOMOSYNTHESIS AND CAD
TECHNIQUE: Bilateral screening digital craniocaudal and mediolateral oblique
mammograms were obtained. Bilateral screening digital breast
tomosynthesis was performed. The images were evaluated with
computer-aided detection.

[R MLO synth-2D]
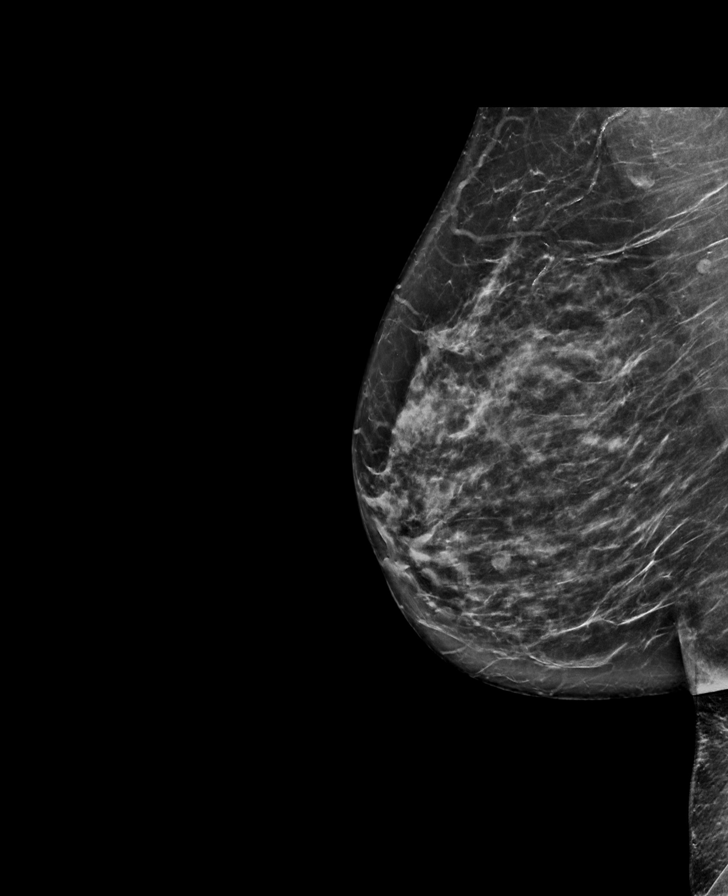

[L MLO synth-2D]
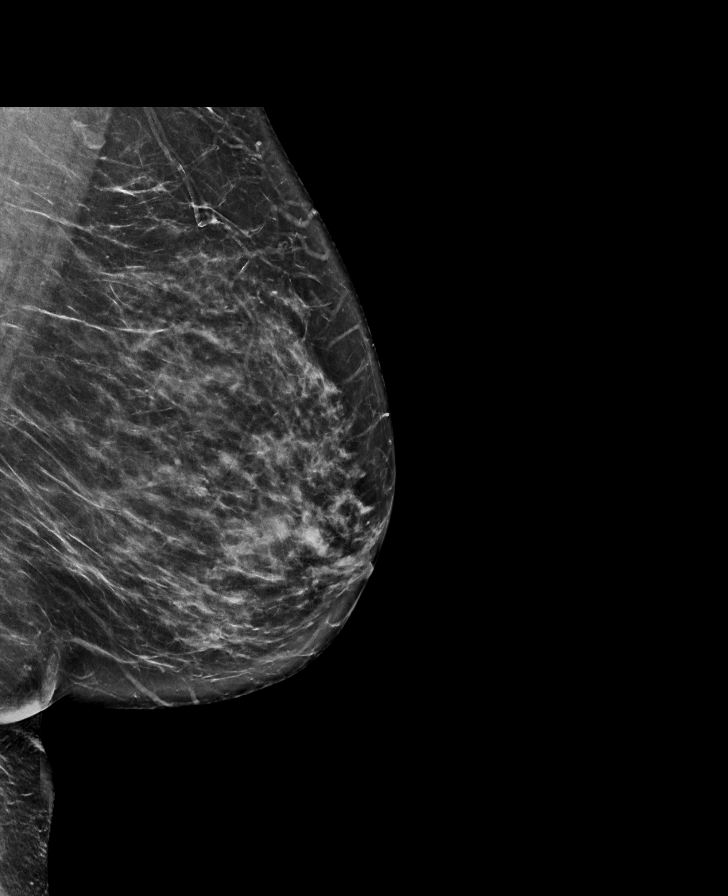

[R CC synth-2D]
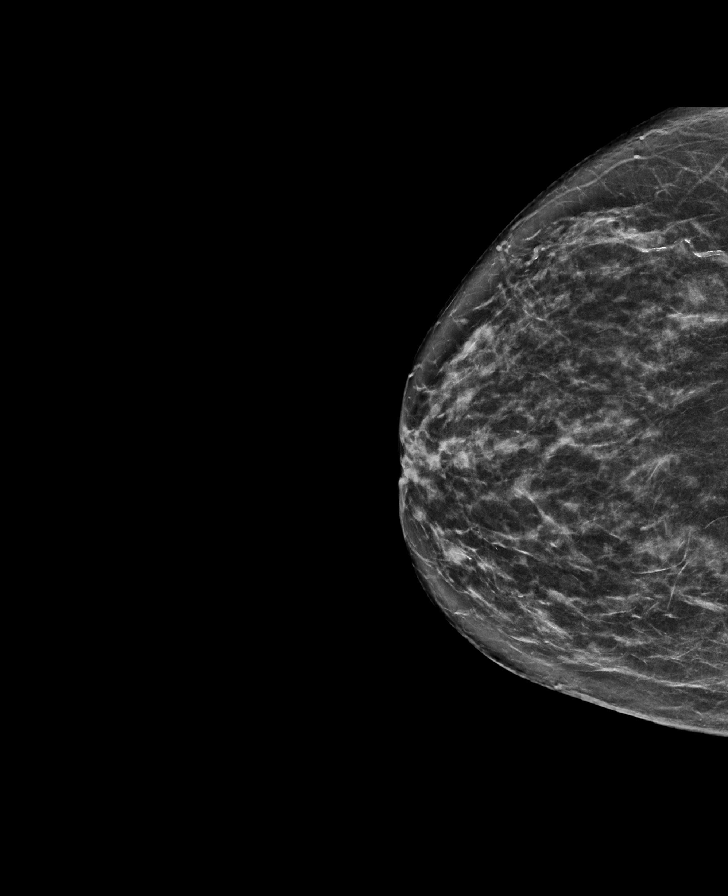

[L CC synth-2D]
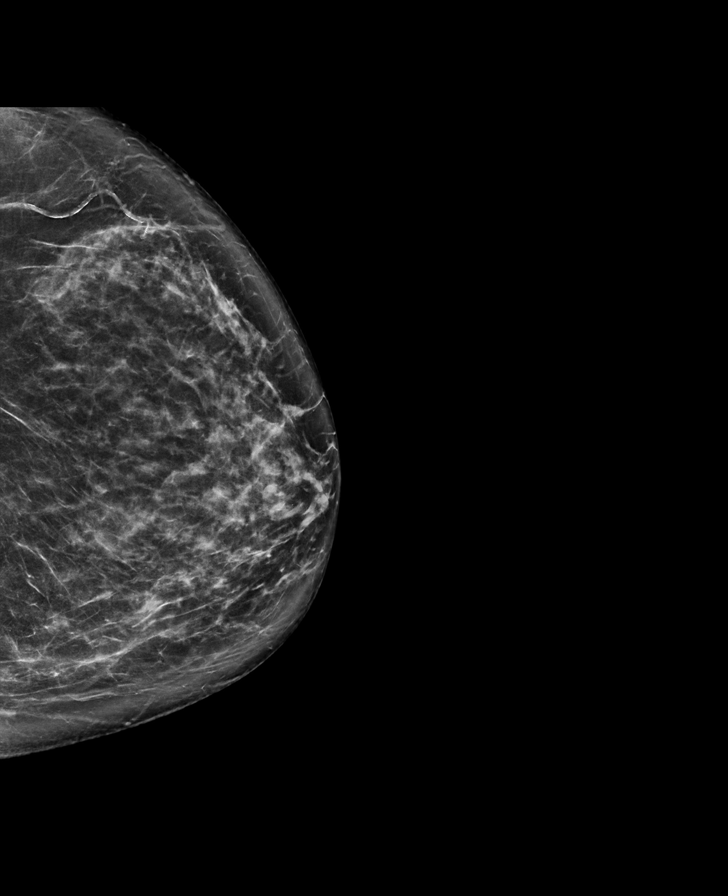

[L MLO tomo · tomo slice 40/79.0]
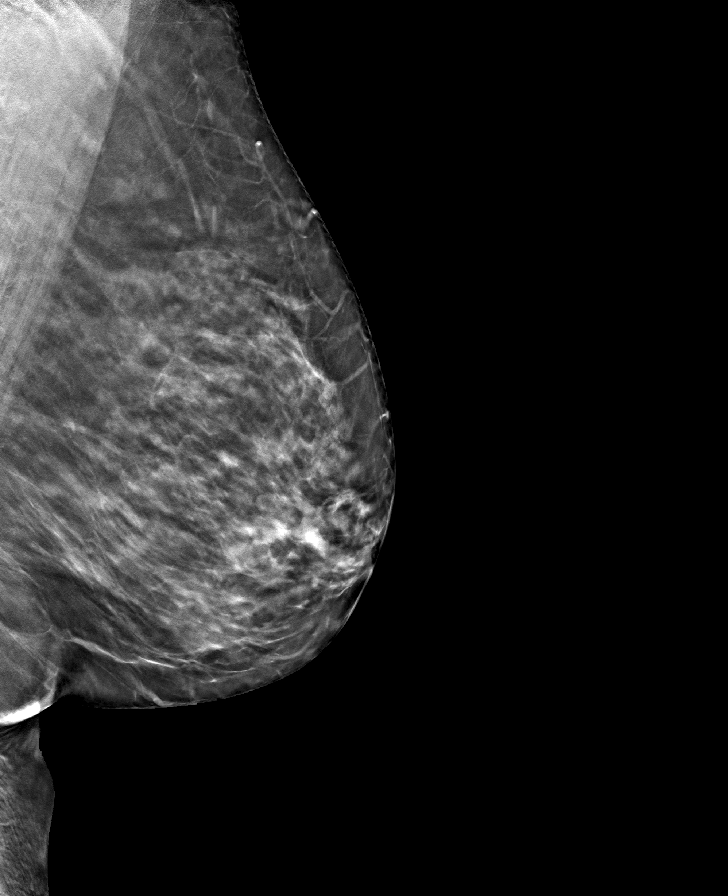

[R MLO tomo · tomo slice 41/80.0]
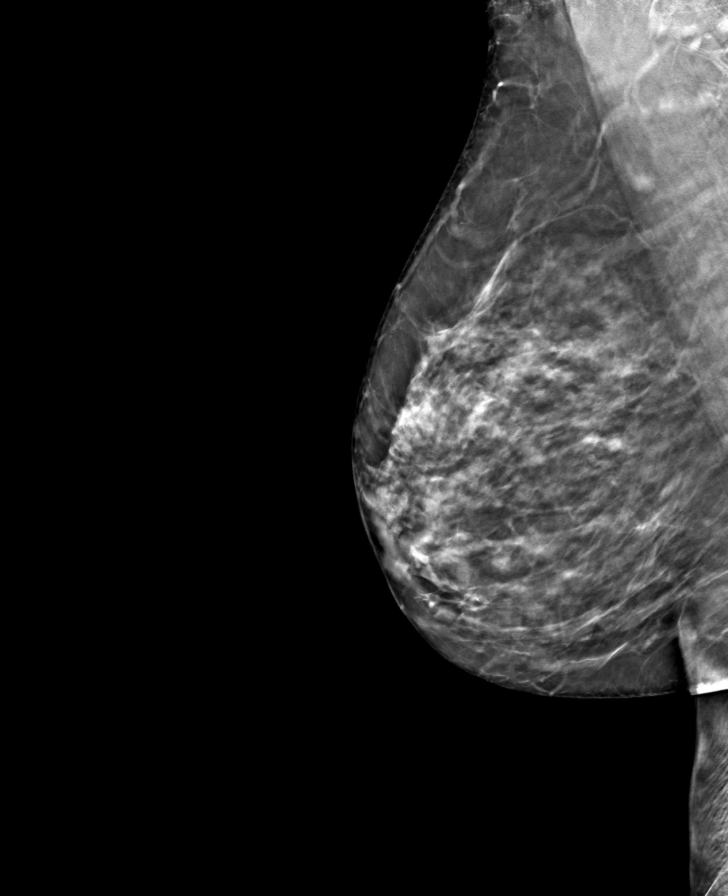

[L CC tomo · tomo slice 41/80.0]
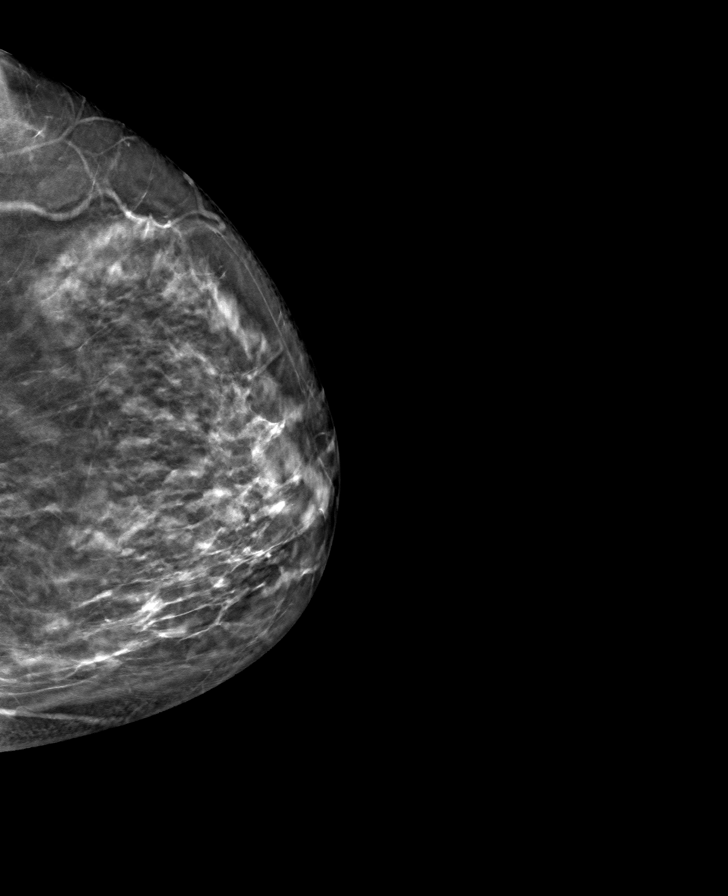

[R CC tomo · tomo slice 37/74.0]
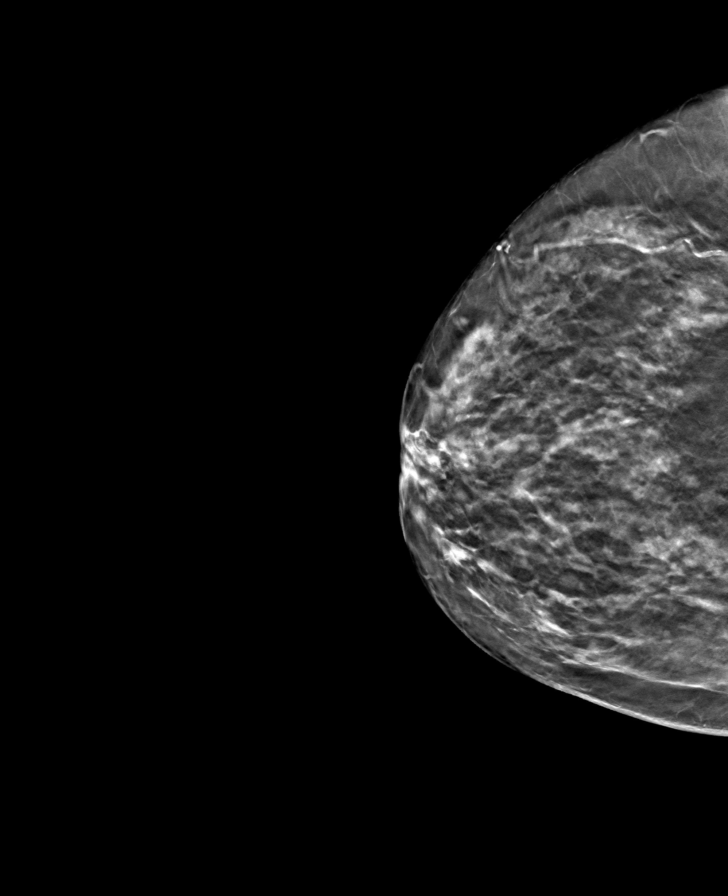

[8 of 24 positions shown; findings below may reference images not displayed]

ACR Breast Density Category c: The breast tissue is heterogeneously
dense, which may obscure small masses.
FINDINGS: There are no findings suspicious for malignancy.
IMPRESSION: No mammographic evidence of malignancy. A result letter of this
screening mammogram will be mailed directly to the patient.

RECOMMENDATION:
Screening mammogram in one year. (Code:[id number])

BI-RADS CATEGORY  1: Negative.

## 2023-04-16 ENCOUNTER — Ambulatory Visit: Payer: Medicare HMO | Attending: Physician Assistant

## 2023-04-16 ENCOUNTER — Other Ambulatory Visit: Payer: Self-pay

## 2023-04-16 DIAGNOSIS — Z9181 History of falling: Secondary | ICD-10-CM | POA: Insufficient documentation

## 2023-04-16 NOTE — Therapy (Signed)
OUTPATIENT PHYSICAL THERAPY LOWER EXTREMITY EVALUATION   Patient Name: KEYLEN KAGAN MRN: [id number] DOB:01/28/1946, 77 y.o., female Today's Date: 04/16/2023  END OF SESSION:  PT End of Session - 04/16/23 0923     Visit Number 1    Number of Visits 12    Date for PT Re-Evaluation 06/27/23    PT Start Time 0932    PT Stop Time 1012    PT Time Calculation (min) 40 min    Activity Tolerance Patient tolerated treatment well    Behavior During Therapy [facility] for tasks assessed/performed             Past Medical History:  Diagnosis Date   Anxiety    High cholesterol    Hypertension    Thyroid disease    History reviewed. No pertinent surgical history. There are no problems to display for this patient.  REFERRING PROVIDER: Teena Irani, PA-C   REFERRING DIAG: History of falling   THERAPY DIAG:  History of falling  Rationale for Evaluation and Treatment: Rehabilitation  ONSET DATE: 3 years ago  SUBJECTIVE:   SUBJECTIVE STATEMENT: Patient reports that she began falling about 3 years and it has been getting worse since then. She notes that she has fallen multiple times recently. She has noticed that if she drops something and tries to pick it up then her legs wont be able to get her back up. She has not been injured with any of these falls. She is afraid of falling while she walks so she avoids walking around the track [place] [employer].   PERTINENT HISTORY: Dementia PAIN:  Are you having pain? No  PRECAUTIONS: Fall  WEIGHT BEARING RESTRICTIONS: No  FALLS:  Has patient fallen in last 6 months? Yes. Number of falls "a couple"   LIVING ENVIRONMENT: Lives with: lives with their spouse Lives in: House/apartment Stairs: Yes: External: 3 steps; can reach both Has following equipment at home: None  OCCUPATION: retired  PLOF: Independent  PATIENT GOALS: improved ability to transfer, be able to clean her house, and be able to pick up items from the floor  NEXT MD  VISIT: 07/02/23  OBJECTIVE:   COGNITION: Overall cognitive status: Within functional limits for tasks assessed     SENSATION: Patient reports rare numbness in her right hand when she sleeps.   EDEMA:  No edema observed  POSTURE: rounded shoulders and forward head  LOWER EXTREMITY ROM: WFL for activities assessed  LOWER EXTREMITY MMT:  MMT Right eval Left eval  Hip flexion 4/5 4/5  Hip extension    Hip abduction    Hip adduction    Hip internal rotation    Hip external rotation    Knee flexion 4+/5 4+/5  Knee extension 4+/5 4+/5  Ankle dorsiflexion 4/5 4/5  Ankle plantarflexion    Ankle inversion    Ankle eversion     (Blank rows = not tested)  FUNCTIONAL TESTS:  5 times sit to stand: 25.40 seconds without UE support (uncontrolled descent on 1 rep)  Timed up and go (TUG): 16.46 seconds required minA for safety with sit to stand and stand to sit transfers  GAIT: Assistive device utilized: None Level of assistance: SBA Comments: decreased gait speed with poor foot clearance and increased lateral sway    TODAY'S TREATMENT:  DATE:     PATIENT EDUCATION:  Education details: Plan of care, prognosis, objective findings, and goals for therapy Person educated: Patient and Spouse Education method: Explanation Education comprehension: verbalized understanding  HOME EXERCISE PROGRAM:   ASSESSMENT:  CLINICAL IMPRESSION: Patient is a 77 y.o. female who was seen today for physical therapy evaluation and treatment due to her history of falling.  She is at a high fall risk as evidenced by her objective measures, gait mechanics, and her history of multiple falls.  She exhibited increased unsteadiness throughout today's assessments as she required intermittent minimal assistance for safety.  Recommend that she continue with skilled physical therapy to  address her impairments to maximize her safety and functional mobility.  OBJECTIVE IMPAIRMENTS: Abnormal gait, decreased balance, decreased mobility, difficulty walking, decreased strength, and postural dysfunction.   ACTIVITY LIMITATIONS: carrying, lifting, bending, standing, stairs, transfers, and locomotion level  PARTICIPATION LIMITATIONS: shopping, community activity, and yard work  PERSONAL FACTORS: Past/current experiences, Time since onset of injury/illness/exacerbation, Transportation, and 1 comorbidity: Dementia  are also affecting patient's functional outcome.   REHAB POTENTIAL: Good  CLINICAL DECISION MAKING: Evolving/moderate complexity  EVALUATION COMPLEXITY: Moderate   GOALS: Goals reviewed with patient? Yes  SHORT TERM GOALS: Target date: 05/07/23 Patient will be able to complete her HEP with minimal assistance. Baseline: Goal status: INITIAL  2.  Patient will improve her 5 times sit to stand time to 20 seconds or less for improved lower extremity power. Baseline:  Goal status: INITIAL  3.  Patient will improve her timed up and go to 14 seconds or less for improved functional mobility. Baseline:  Goal status: INITIAL  LONG TERM GOALS: Target date: 05/28/23  Patient will be able to complete her advanced HEP with minimal assistance. Baseline:  Goal status: INITIAL  2.  Patient will improve her timed up and go time to 12 seconds or less to reduce her fall risk. Baseline:  Goal status: INITIAL  3.  Patient will improve her 5 times sit to stand time to 14 seconds or less to reduce her fall risk. Baseline:  Goal status: INITIAL  4.  Patient will be able to safely pick up an item from the floor without external assistance for improved function with household tasks. Baseline:  Goal status: INITIAL  PLAN:  PT FREQUENCY: 2x/week  PT DURATION: 6 weeks  PLANNED INTERVENTIONS: Therapeutic exercises, Therapeutic activity, Neuromuscular re-education, Balance  training, Gait training, Patient/Family education, Self Care, Stair training, and Re-evaluation  PLAN FOR NEXT SESSION: nustep, balance interventions (ankle, hip, and stepping strategies), and focus on lower extremity power   Granville Lewis, PT 04/16/2023, 3:57 PM

## 2023-04-18 ENCOUNTER — Encounter: Payer: Self-pay | Admitting: Physician Assistant

## 2023-04-22 ENCOUNTER — Ambulatory Visit: Payer: Medicare HMO

## 2023-04-22 DIAGNOSIS — Z9181 History of falling: Secondary | ICD-10-CM | POA: Diagnosis not present

## 2023-04-22 NOTE — Therapy (Signed)
OUTPATIENT PHYSICAL THERAPY LOWER EXTREMITY TREATMENT   Patient Name: Kristine Carson MRN: [id number] DOB:[birth year], 77 y.o., female Today's Date: 04/22/2023  END OF SESSION:  PT End of Session - 04/22/23 0935     Visit Number 2    Number of Visits 12    Date for PT Re-Evaluation 06/27/23    PT Start Time 0931    PT Stop Time 1015    PT Time Calculation (min) 44 min    Activity Tolerance Patient tolerated treatment well    Behavior During Therapy [facility] for tasks assessed/performed             Past Medical History:  Diagnosis Date   Anxiety    High cholesterol    Hypertension    Thyroid disease    History reviewed. No pertinent surgical history. There are no problems to display for this patient.  REFERRING PROVIDER: Teena Irani, PA-C   REFERRING DIAG: History of falling   THERAPY DIAG:  History of falling  Rationale for Evaluation and Treatment: Rehabilitation  ONSET DATE: 3 years ago  SUBJECTIVE:   SUBJECTIVE STATEMENT: Patient reports that she feels good today. She notes that she has not fallen since her last appointment.   PERTINENT HISTORY: Dementia PAIN:  Are you having pain? No  PRECAUTIONS: Fall  WEIGHT BEARING RESTRICTIONS: No  FALLS:  Has patient fallen in last 6 months? Yes. Number of falls "a couple"   LIVING ENVIRONMENT: Lives with: lives with their spouse Lives in: House/apartment Stairs: Yes: External: 3 steps; can reach both Has following equipment at home: None  OCCUPATION: retired  PLOF: Independent  PATIENT GOALS: improved ability to transfer, be able to clean her house, and be able to pick up items from the floor  NEXT MD VISIT: 07/02/23  OBJECTIVE: all objective measures were assessed at her initial evaluation on 04/16/23 unless otherwise noted  COGNITION: Overall cognitive status: Within functional limits for tasks assessed     SENSATION: Patient reports rare numbness in her right hand when she sleeps.   EDEMA:   No edema observed  POSTURE: rounded shoulders and forward head  LOWER EXTREMITY ROM: WFL for activities assessed  LOWER EXTREMITY MMT:  MMT Right eval Left eval  Hip flexion 4/5 4/5  Hip extension    Hip abduction    Hip adduction    Hip internal rotation    Hip external rotation    Knee flexion 4+/5 4+/5  Knee extension 4+/5 4+/5  Ankle dorsiflexion 4/5 4/5  Ankle plantarflexion    Ankle inversion    Ankle eversion     (Blank rows = not tested)  FUNCTIONAL TESTS:  5 times sit to stand: 25.40 seconds without UE support (uncontrolled descent on 1 rep)  Timed up and go (TUG): 16.46 seconds required minA for safety with sit to stand and stand to sit transfers  GAIT: Assistive device utilized: None Level of assistance: SBA Comments: decreased gait speed with poor foot clearance and increased lateral sway    TODAY'S TREATMENT:  DATE:                                     04/22/23 EXERCISE LOG  Exercise Repetitions and Resistance Comments  Nustep  L3 x 17 minutes   LAQ 3# x 25 reps    Marching on foam  3 minutes Required verbal cueing for improved foot clearance; BUE support  Step up  6" step x 2 minutes BUE support; 1 minute leading with each LE  Standing heel raise 3 minutes BUE support  Seated HS curl  Green t-band x 3 minutes each     Blank cell = exercise not performed today   PATIENT EDUCATION:  Education details: Plan of care, prognosis, objective findings, and goals for therapy Person educated: Patient and Spouse Education method: Explanation Education comprehension: verbalized understanding  HOME EXERCISE PROGRAM:   ASSESSMENT:  CLINICAL IMPRESSION: Patient was introduced to multiple new interventions for improved lower extremity strength and stability. She required moderate verbal cueing throughout treatment to maintain proper  exercise performance as her biomechanics would deteriorate throughout each intervention, but then she would be able to correct her form after cueing. She reported feeling tired upon the conclusion of treatment. She continues to require skilled physical therapy to address her remaining impairments to maximize her safety and functional mobility.    OBJECTIVE IMPAIRMENTS: Abnormal gait, decreased balance, decreased mobility, difficulty walking, decreased strength, and postural dysfunction.   ACTIVITY LIMITATIONS: carrying, lifting, bending, standing, stairs, transfers, and locomotion level  PARTICIPATION LIMITATIONS: shopping, community activity, and yard work  PERSONAL FACTORS: Past/current experiences, Time since onset of injury/illness/exacerbation, Transportation, and 1 comorbidity: Dementia  are also affecting patient's functional outcome.   REHAB POTENTIAL: Good  CLINICAL DECISION MAKING: Evolving/moderate complexity  EVALUATION COMPLEXITY: Moderate   GOALS: Goals reviewed with patient? Yes  SHORT TERM GOALS: Target date: 05/07/23 Patient will be able to complete her HEP with minimal assistance. Baseline: Goal status: INITIAL  2.  Patient will improve her 5 times sit to stand time to 20 seconds or less for improved lower extremity power. Baseline:  Goal status: INITIAL  3.  Patient will improve her timed up and go to 14 seconds or less for improved functional mobility. Baseline:  Goal status: INITIAL  LONG TERM GOALS: Target date: 05/28/23  Patient will be able to complete her advanced HEP with minimal assistance. Baseline:  Goal status: INITIAL  2.  Patient will improve her timed up and go time to 12 seconds or less to reduce her fall risk. Baseline:  Goal status: INITIAL  3.  Patient will improve her 5 times sit to stand time to 14 seconds or less to reduce her fall risk. Baseline:  Goal status: INITIAL  4.  Patient will be able to safely pick up an item from the  floor without external assistance for improved function with household tasks. Baseline:  Goal status: INITIAL  PLAN:  PT FREQUENCY: 2x/week  PT DURATION: 6 weeks  PLANNED INTERVENTIONS: Therapeutic exercises, Therapeutic activity, Neuromuscular re-education, Balance training, Gait training, Patient/Family education, Self Care, Stair training, and Re-evaluation  PLAN FOR NEXT SESSION: nustep, balance interventions (ankle, hip, and stepping strategies), and focus on lower extremity power   Granville Lewis, PT 04/22/2023, 12:13 PM

## 2023-04-25 ENCOUNTER — Ambulatory Visit: Payer: Medicare HMO | Admitting: *Deleted

## 2023-04-25 DIAGNOSIS — Z9181 History of falling: Secondary | ICD-10-CM | POA: Diagnosis not present

## 2023-04-25 NOTE — Therapy (Signed)
OUTPATIENT PHYSICAL THERAPY LOWER EXTREMITY TREATMENT   Patient Name: Kristine Carson MRN: [id number] DOB:December 29, 1945, 77 y.o., female Today's Date: 04/25/2023  END OF SESSION:  PT End of Session - 04/25/23 0944     Visit Number 3    Number of Visits 12    Date for PT Re-Evaluation 06/27/23    PT Start Time 0930    PT Stop Time 1017    PT Time Calculation (min) 47 min             Past Medical History:  Diagnosis Date   Anxiety    High cholesterol    Hypertension    Thyroid disease    No past surgical history on file. There are no problems to display for this patient.  REFERRING PROVIDER: Teena Irani, PA-C   REFERRING DIAG: History of falling   THERAPY DIAG:  History of falling  Rationale for Evaluation and Treatment: Rehabilitation  ONSET DATE: 3 years ago  SUBJECTIVE:   SUBJECTIVE STATEMENT: Patient reports that she feels good today. She notes that she has not fallen since her last appointment.   PERTINENT HISTORY: Dementia PAIN:  Are you having pain? No  PRECAUTIONS: Fall  WEIGHT BEARING RESTRICTIONS: No  FALLS:  Has patient fallen in last 6 months? Yes. Number of falls "a couple"   LIVING ENVIRONMENT: Lives with: lives with their spouse Lives in: House/apartment Stairs: Yes: External: 3 steps; can reach both Has following equipment at home: None  OCCUPATION: retired  PLOF: Independent  PATIENT GOALS: improved ability to transfer, be able to clean her house, and be able to pick up items from the floor  NEXT MD VISIT: 07/02/23  OBJECTIVE: all objective measures were assessed at her initial evaluation on 04/16/23 unless otherwise noted  COGNITION: Overall cognitive status: Within functional limits for tasks assessed     SENSATION: Patient reports rare numbness in her right hand when she sleeps.   EDEMA:  No edema observed  POSTURE: rounded shoulders and forward head  LOWER EXTREMITY ROM: WFL for activities assessed  LOWER  EXTREMITY MMT:  MMT Right eval Left eval  Hip flexion 4/5 4/5  Hip extension    Hip abduction    Hip adduction    Hip internal rotation    Hip external rotation    Knee flexion 4+/5 4+/5  Knee extension 4+/5 4+/5  Ankle dorsiflexion 4/5 4/5  Ankle plantarflexion    Ankle inversion    Ankle eversion     (Blank rows = not tested)  FUNCTIONAL TESTS:  5 times sit to stand: 25.40 seconds without UE support (uncontrolled descent on 1 rep)  Timed up and go (TUG): 16.46 seconds required minA for safety with sit to stand and stand to sit transfers  GAIT: Assistive device utilized: None Level of assistance: SBA Comments: decreased gait speed with poor foot clearance and increased lateral sway    TODAY'S TREATMENT:  DATE:                                     04/25/23 EXERCISE LOG  Exercise Repetitions and Resistance Comments  Nustep  L3 x 15 minutes   LAQ 3#  3x10  reps  Bil.   NBOS on foam , and tandem stance one foot on  With UE reaches 2x20 each to challenge Required verbal cueing for improved foot clearance; BUE support  Step up  6" step  2x10 Bil BUE support; 1 minute leading with each LE  Standing heel  and toe raise  2x 10-15 each BUE support  Seated HS curl  Green t-band x 3 minutes each    Balance beam  Side stepping   4 mins    Blank cell = exercise not performed today      PATIENT EDUCATION:  Education details: Plan of care, prognosis, objective findings, and goals for therapy Person educated: Patient and Spouse Education method: Explanation Education comprehension: verbalized understanding  HOME EXERCISE PROGRAM:   ASSESSMENT:  CLINICAL IMPRESSION: Pt arrived today reporting doing well after last visit.  Today's Rx focused on LE strengthening in standing and sitting as well as balance act.'s. Pt reports noticing balance improvement  already. Mainly fatigue end of session.   OBJECTIVE IMPAIRMENTS: Abnormal gait, decreased balance, decreased mobility, difficulty walking, decreased strength, and postural dysfunction.   ACTIVITY LIMITATIONS: carrying, lifting, bending, standing, stairs, transfers, and locomotion level  PARTICIPATION LIMITATIONS: shopping, community activity, and yard work  PERSONAL FACTORS: Past/current experiences, Time since onset of injury/illness/exacerbation, Transportation, and 1 comorbidity: Dementia  are also affecting patient's functional outcome.   REHAB POTENTIAL: Good  CLINICAL DECISION MAKING: Evolving/moderate complexity  EVALUATION COMPLEXITY: Moderate   GOALS: Goals reviewed with patient? Yes  SHORT TERM GOALS: Target date: 05/07/23 Patient will be able to complete her HEP with minimal assistance. Baseline: Goal status: INITIAL  2.  Patient will improve her 5 times sit to stand time to 20 seconds or less for improved lower extremity power. Baseline:  Goal status: INITIAL  3.  Patient will improve her timed up and go to 14 seconds or less for improved functional mobility. Baseline:  Goal status: INITIAL  LONG TERM GOALS: Target date: 05/28/23  Patient will be able to complete her advanced HEP with minimal assistance. Baseline:  Goal status: INITIAL  2.  Patient will improve her timed up and go time to 12 seconds or less to reduce her fall risk. Baseline:  Goal status: INITIAL  3.  Patient will improve her 5 times sit to stand time to 14 seconds or less to reduce her fall risk. Baseline:  Goal status: INITIAL  4.  Patient will be able to safely pick up an item from the floor without external assistance for improved function with household tasks. Baseline:  Goal status: INITIAL  PLAN:  PT FREQUENCY: 2x/week  PT DURATION: 6 weeks  PLANNED INTERVENTIONS: Therapeutic exercises, Therapeutic activity, Neuromuscular re-education, Balance training, Gait training,  Patient/Family education, Self Care, Stair training, and Re-evaluation  PLAN FOR NEXT SESSION: nustep, balance interventions (ankle, hip, and stepping strategies), and focus on lower extremity power   Marsha Gundlach,CHRIS, PTA 04/25/2023, 10:20 AM

## 2023-04-29 ENCOUNTER — Ambulatory Visit: Payer: Medicare HMO | Attending: Physician Assistant

## 2023-04-29 DIAGNOSIS — Z9181 History of falling: Secondary | ICD-10-CM | POA: Insufficient documentation

## 2023-04-29 NOTE — Therapy (Signed)
OUTPATIENT PHYSICAL THERAPY LOWER EXTREMITY TREATMENT   Patient Name: Kristine Carson MRN: [id number] DOB:June 11, 1946, 77 y.o., female Today's Date: 04/29/2023  END OF SESSION:  PT End of Session - 04/29/23 0846     Visit Number 4    Number of Visits 12    Date for PT Re-Evaluation 06/27/23    PT Start Time 0845    PT Stop Time 0924   Patient requested to leave early.   PT Time Calculation (min) 39 min    Activity Tolerance Patient tolerated treatment well    Behavior During Therapy WFL for tasks assessed/performed             Past Medical History:  Diagnosis Date   Anxiety    High cholesterol    Hypertension    Thyroid disease    History reviewed. No pertinent surgical history. There are no problems to display for this patient.  REFERRING PROVIDER: Teena Irani, PA-C   REFERRING DIAG: History of falling   THERAPY DIAG:  History of falling  Rationale for Evaluation and Treatment: Rehabilitation  ONSET DATE: 3 years ago  SUBJECTIVE:   SUBJECTIVE STATEMENT: Patient reports that she feels really good today. She feels that therapy is really helping as she was able to go downstairs and exercise after her last appointment.   PERTINENT HISTORY: Dementia PAIN:  Are you having pain? No  PRECAUTIONS: Fall  WEIGHT BEARING RESTRICTIONS: No  FALLS:  Has patient fallen in last 6 months? Yes. Number of falls "a couple"   LIVING ENVIRONMENT: Lives with: lives with their spouse Lives in: House/apartment Stairs: Yes: External: 3 steps; can reach both Has following equipment at home: None  OCCUPATION: retired  PLOF: Independent  PATIENT GOALS: improved ability to transfer, be able to clean her house, and be able to pick up items from the floor  NEXT MD VISIT: 07/02/23  OBJECTIVE: all objective measures were assessed at her initial evaluation on 04/16/23 unless otherwise noted  COGNITION: Overall cognitive status: Within functional limits for tasks  assessed     SENSATION: Patient reports rare numbness in her right hand when she sleeps.   EDEMA:  No edema observed  POSTURE: rounded shoulders and forward head  LOWER EXTREMITY ROM: WFL for activities assessed  LOWER EXTREMITY MMT:  MMT Right eval Left eval  Hip flexion 4/5 4/5  Hip extension    Hip abduction    Hip adduction    Hip internal rotation    Hip external rotation    Knee flexion 4+/5 4+/5  Knee extension 4+/5 4+/5  Ankle dorsiflexion 4/5 4/5  Ankle plantarflexion    Ankle inversion    Ankle eversion     (Blank rows = not tested)  FUNCTIONAL TESTS:  5 times sit to stand: 25.40 seconds without UE support (uncontrolled descent on 1 rep)  Timed up and go (TUG): 16.46 seconds required minA for safety with sit to stand and stand to sit transfers  GAIT: Assistive device utilized: None Level of assistance: SBA Comments: decreased gait speed with poor foot clearance and increased lateral sway    TODAY'S TREATMENT:  DATE:                                     04/29/23 EXERCISE LOG  Exercise Repetitions and Resistance Comments  Nustep L3 x 17 minutes   LAQ 3# x 3 minutes   Standing HS curl  3# x 20 reps each    Rocker board  4 minutes BUE support progressing to 1 HHA then to no HHA   Side stepping on foam 2 minutes  Intermittent UE support   Step up  8" step x 2.5 minutes Alternating LE with BUE support    Blank cell = exercise not performed today                                    04/25/23 EXERCISE LOG  Exercise Repetitions and Resistance Comments  Nustep  L3 x 15 minutes   LAQ 3#  3x10  reps  Bil.   NBOS on foam , and tandem stance one foot on  With UE reaches 2x20 each to challenge Required verbal cueing for improved foot clearance; BUE support  Step up  6" step  2x10 Bil BUE support; 1 minute leading with each LE  Standing heel   and toe raise  2x 10-15 each BUE support  Seated HS curl  Green t-band x 3 minutes each    Balance beam  Side stepping   4 mins    Blank cell = exercise not performed today   PATIENT EDUCATION:  Education details: Plan of care, prognosis, objective findings, and goals for therapy Person educated: Patient and Spouse Education method: Explanation Education comprehension: verbalized understanding  HOME EXERCISE PROGRAM:   ASSESSMENT:  CLINICAL IMPRESSION: Patient was progressed with side stepping on foam and familiar interventions for improved lower extremity strength and stability. She required minimal cueing with standing hamstring curls for proper exercise performance to isolate hamstring engagement while limiting hip extension. She reported feeling tired upon the conclusion of treatment. She continues to require skilled physical therapy to address her remaining impairments to maximize her safety and functional mobility.    OBJECTIVE IMPAIRMENTS: Abnormal gait, decreased balance, decreased mobility, difficulty walking, decreased strength, and postural dysfunction.   ACTIVITY LIMITATIONS: carrying, lifting, bending, standing, stairs, transfers, and locomotion level  PARTICIPATION LIMITATIONS: shopping, community activity, and yard work  PERSONAL FACTORS: Past/current experiences, Time since onset of injury/illness/exacerbation, Transportation, and 1 comorbidity: Dementia  are also affecting patient's functional outcome.   REHAB POTENTIAL: Good  CLINICAL DECISION MAKING: Evolving/moderate complexity  EVALUATION COMPLEXITY: Moderate   GOALS: Goals reviewed with patient? Yes  SHORT TERM GOALS: Target date: 05/07/23 Patient will be able to complete her HEP with minimal assistance. Baseline: Goal status: INITIAL  2.  Patient will improve her 5 times sit to stand time to 20 seconds or less for improved lower extremity power. Baseline:  Goal status: INITIAL  3.  Patient will  improve her timed up and go to 14 seconds or less for improved functional mobility. Baseline:  Goal status: INITIAL  LONG TERM GOALS: Target date: 05/28/23  Patient will be able to complete her advanced HEP with minimal assistance. Baseline:  Goal status: INITIAL  2.  Patient will improve her timed up and go time to 12 seconds or less to reduce her fall risk. Baseline:  Goal status: INITIAL  3.  Patient will improve her 5 times sit to stand time to 14 seconds or less to reduce her fall risk. Baseline:  Goal status: INITIAL  4.  Patient will be able to safely pick up an item from the floor without external assistance for improved function with household tasks. Baseline:  Goal status: INITIAL  PLAN:  PT FREQUENCY: 2x/week  PT DURATION: 6 weeks  PLANNED INTERVENTIONS: Therapeutic exercises, Therapeutic activity, Neuromuscular re-education, Balance training, Gait training, Patient/Family education, Self Care, Stair training, and Re-evaluation  PLAN FOR NEXT SESSION: nustep, balance interventions (ankle, hip, and stepping strategies), and focus on lower extremity power   Granville Lewis, PT 04/29/2023, 10:28 AM

## 2023-05-02 ENCOUNTER — Ambulatory Visit: Payer: Medicare HMO

## 2023-05-02 DIAGNOSIS — Z9181 History of falling: Secondary | ICD-10-CM

## 2023-05-02 NOTE — Therapy (Signed)
OUTPATIENT PHYSICAL THERAPY LOWER EXTREMITY TREATMENT   Patient Name: Kristine Carson MRN: [id number] DOB:Sep 05, 1946, 77 y.o., female Today's Date: 05/02/2023  END OF SESSION:  PT End of Session - 05/02/23 0844     Visit Number 5    Number of Visits 12    Date for PT Re-Evaluation 06/27/23    PT Start Time 0841    PT Stop Time 0928    PT Time Calculation (min) 47 min    Activity Tolerance Patient tolerated treatment well    Behavior During Therapy [facility] for tasks assessed/performed             Past Medical History:  Diagnosis Date   Anxiety    High cholesterol    Hypertension    Thyroid disease    History reviewed. No pertinent surgical history. There are no problems to display for this patient.  REFERRING PROVIDER: Teena Irani, PA-C   REFERRING DIAG: History of falling   THERAPY DIAG:  History of falling  Rationale for Evaluation and Treatment: Rehabilitation  ONSET DATE: 3 years ago  SUBJECTIVE:   SUBJECTIVE STATEMENT: Pt reports 3/10 left posterior shoulder pain today.  States she used cream and heating pad last night which helped.   PERTINENT HISTORY: Dementia PAIN:  Are you having pain? Yes: NPRS scale: 3/10 Pain location: posterior left shoulder  PRECAUTIONS: Fall  WEIGHT BEARING RESTRICTIONS: No  FALLS:  Has patient fallen in last 6 months? Yes. Number of falls "a couple"   LIVING ENVIRONMENT: Lives with: lives with their spouse Lives in: House/apartment Stairs: Yes: External: 3 steps; can reach both Has following equipment at home: None  OCCUPATION: retired  PLOF: Independent  PATIENT GOALS: improved ability to transfer, be able to clean her house, and be able to pick up items from the floor  NEXT MD VISIT: 07/02/23  OBJECTIVE: all objective measures were assessed at her initial evaluation on 04/16/23 unless otherwise noted  COGNITION: Overall cognitive status: Within functional limits for tasks assessed     SENSATION: Patient  reports rare numbness in her right hand when she sleeps.   EDEMA:  No edema observed  POSTURE: rounded shoulders and forward head  LOWER EXTREMITY ROM: WFL for activities assessed  LOWER EXTREMITY MMT:  MMT Right eval Left eval  Hip flexion 4/5 4/5  Hip extension    Hip abduction    Hip adduction    Hip internal rotation    Hip external rotation    Knee flexion 4+/5 4+/5  Knee extension 4+/5 4+/5  Ankle dorsiflexion 4/5 4/5  Ankle plantarflexion    Ankle inversion    Ankle eversion     (Blank rows = not tested)  FUNCTIONAL TESTS:  5 times sit to stand: 25.40 seconds without UE support (uncontrolled descent on 1 rep)  Timed up and go (TUG): 16.46 seconds required minA for safety with sit to stand and stand to sit transfers  GAIT: Assistive device utilized: None Level of assistance: SBA Comments: decreased gait speed with poor foot clearance and increased lateral sway    TODAY'S TREATMENT:  DATE:                                    05/02/23 EXERCISE LOG  Exercise Repetitions and Resistance Comments  Nustep Lvl 4 x 18 minutes BLE only due to shoulder pain  LAQ 3# x 3.5 minutes   Standing HS curl  3# x 25 reps each    Rocker board  4 minutes BUE support progressing to 1 HHA then to no HHA   Tandum gait on foam  2 mins   Side stepping on foam 3 minutes  Intermittent UE support   Step up   Alternating LE with BUE support    Blank cell = exercise not performed today                                     PATIENT EDUCATION:  Education details: Plan of care, prognosis, objective findings, and goals for therapy Person educated: Patient and Spouse Education method: Explanation Education comprehension: verbalized understanding  HOME EXERCISE PROGRAM:   ASSESSMENT:  CLINICAL IMPRESSION: Pt arrives for today's treatment session reporting 3/10 left  posterior shoulder pain.  Due to this shoulder pain pt performed Nustep with BLEs only.  Pt able to perform all exercises today with increased reps or time with minimal fatigue.  Pt does report some indigestion during exercises, but able to perform all reps asked of her.  Pt requiring intermittent use of BUEs with all balance/Airex exercises.  Pt denied any change in pain at completion of today's treatment session.  OBJECTIVE IMPAIRMENTS: Abnormal gait, decreased balance, decreased mobility, difficulty walking, decreased strength, and postural dysfunction.   ACTIVITY LIMITATIONS: carrying, lifting, bending, standing, stairs, transfers, and locomotion level  PARTICIPATION LIMITATIONS: shopping, community activity, and yard work  PERSONAL FACTORS: Past/current experiences, Time since onset of injury/illness/exacerbation, Transportation, and 1 comorbidity: Dementia  are also affecting patient's functional outcome.   REHAB POTENTIAL: Good  CLINICAL DECISION MAKING: Evolving/moderate complexity  EVALUATION COMPLEXITY: Moderate   GOALS: Goals reviewed with patient? Yes  SHORT TERM GOALS: Target date: 05/07/23 Patient will be able to complete her HEP with minimal assistance. Baseline: Goal status: MET  2.  Patient will improve her 5 times sit to stand time to 20 seconds or less for improved lower extremity power. Baseline: 7/5: 18.87 Goal status: MET  3.  Patient will improve her timed up and go to 14 seconds or less for improved functional mobility. Baseline: 7/5: 14.3 seconds Goal status: MET  LONG TERM GOALS: Target date: 05/28/23  Patient will be able to complete her advanced HEP with minimal assistance. Baseline:  Goal status: IN PROGRESS  2.  Patient will improve her timed up and go time to 12 seconds or less to reduce her fall risk. Baseline: 7/5: 14.3 seconds Goal status: IN PROGRESS  3.  Patient will improve her 5 times sit to stand time to 14 seconds or less to reduce her  fall risk. Baseline: 7/5: 18.87 seconds  Goal status: IN PROGRESS  4.  Patient will be able to safely pick up an item from the floor without external assistance for improved function with household tasks. Baseline:  Goal status: IN PROGRESS  PLAN:  PT FREQUENCY: 2x/week  PT DURATION: 6 weeks  PLANNED INTERVENTIONS: Therapeutic exercises, Therapeutic activity, Neuromuscular re-education, Balance training, Gait training, Patient/Family education, Self Care, Stair  training, and Re-evaluation  PLAN FOR NEXT SESSION: nustep, balance interventions (ankle, hip, and stepping strategies), and focus on lower extremity power   Newman Pies, PTA 05/02/2023, 9:41 AM

## 2023-05-05 ENCOUNTER — Ambulatory Visit: Payer: Medicare HMO

## 2023-05-05 VITALS — Wt 143.4 lb

## 2023-05-05 DIAGNOSIS — Z9181 History of falling: Secondary | ICD-10-CM | POA: Diagnosis not present

## 2023-05-05 NOTE — Therapy (Signed)
OUTPATIENT PHYSICAL THERAPY LOWER EXTREMITY TREATMENT   Patient Name: Kristine Carson MRN: [id number] DOB:1945/12/05, 77 y.o., female Today's Date: 05/05/2023  END OF SESSION:  PT End of Session - 05/05/23 0907     Visit Number 6    Number of Visits 12    Date for PT Re-Evaluation 06/27/23    PT Start Time 0847    PT Stop Time 0923   Patient reports that she needed to leave early to go with her husband to his appointment at the [place].   PT Time Calculation (min) 36 min    Activity Tolerance Patient tolerated treatment well    Behavior During Therapy WFL for tasks assessed/performed             Past Medical History:  Diagnosis Date   Anxiety    High cholesterol    Hypertension    Thyroid disease    History reviewed. No pertinent surgical history. There are no problems to display for this patient.  REFERRING PROVIDER: Teena Irani, PA-C   REFERRING DIAG: History of falling   THERAPY DIAG:  History of falling  Rationale for Evaluation and Treatment: Rehabilitation  ONSET DATE: 3 years ago  SUBJECTIVE:   SUBJECTIVE STATEMENT: Patient reports that her left shoulder still hurts some, but not as bad as last time. However, she feels good otherwise.     PERTINENT HISTORY: Dementia PAIN:  Are you having pain? Yes: NPRS scale: "a little bit"/10 Pain location: posterior left shoulder  PRECAUTIONS: Fall  WEIGHT BEARING RESTRICTIONS: No  FALLS:  Has patient fallen in last 6 months? Yes. Number of falls "a couple"   LIVING ENVIRONMENT: Lives with: lives with their spouse Lives in: House/apartment Stairs: Yes: External: 3 steps; can reach both Has following equipment at home: None  OCCUPATION: retired  PLOF: Independent  PATIENT GOALS: improved ability to transfer, be able to clean her house, and be able to pick up items from the floor  NEXT MD VISIT: 07/02/23  OBJECTIVE: all objective measures were assessed at her initial evaluation on 04/16/23 unless  otherwise noted  COGNITION: Overall cognitive status: Within functional limits for tasks assessed     SENSATION: Patient reports rare numbness in her right hand when she sleeps.   EDEMA:  No edema observed  POSTURE: rounded shoulders and forward head  LOWER EXTREMITY ROM: WFL for activities assessed  LOWER EXTREMITY MMT:  MMT Right eval Left eval  Hip flexion 4/5 4/5  Hip extension    Hip abduction    Hip adduction    Hip internal rotation    Hip external rotation    Knee flexion 4+/5 4+/5  Knee extension 4+/5 4+/5  Ankle dorsiflexion 4/5 4/5  Ankle plantarflexion    Ankle inversion    Ankle eversion     (Blank rows = not tested)  FUNCTIONAL TESTS:  5 times sit to stand: 25.40 seconds without UE support (uncontrolled descent on 1 rep)  Timed up and go (TUG): 16.46 seconds required minA for safety with sit to stand and stand to sit transfers  GAIT: Assistive device utilized: None Level of assistance: SBA Comments: decreased gait speed with poor foot clearance and increased lateral sway    TODAY'S TREATMENT:  DATE:                                     05/05/23 EXERCISE LOG  Exercise Repetitions and Resistance Comments  Nustep  L4 x 17 minutes BUE and BLE  Rocker board  5 minutes  Intermittent UE support  Side stepping on foam 2 minutes Intermittent need for minA for safety   Tandem walking on foam  2 minutes  Intermittent need for minA for safety   LAQ 4# x 3 minutes        Blank cell = exercise not performed today                                    05/02/23 EXERCISE LOG  Exercise Repetitions and Resistance Comments  Nustep Lvl 4 x 18 minutes BLE only due to shoulder pain  LAQ 3# x 3.5 minutes   Standing HS curl  3# x 25 reps each    Rocker board  4 minutes BUE support progressing to 1 HHA then to no HHA   Tandum gait on foam  2 mins    Side stepping on foam 3 minutes  Intermittent UE support   Step up   Alternating LE with BUE support    Blank cell = exercise not performed today                                     PATIENT EDUCATION:  Education details: Plan of care, prognosis, objective findings, and goals for therapy Person educated: Patient and Spouse Education method: Explanation Education comprehension: verbalized understanding  HOME EXERCISE PROGRAM:   ASSESSMENT:  CLINICAL IMPRESSION: Patient was progressed with familiar interventions for improved lower extremity strength and stability with moderate difficulty. She required intermittent minimal assistance with today's dynamic balance interventions for safety. She required minimal verbal cueing with side stepping on foam for improved step length to promote improved time in singe leg stance. She reported that her legs were "a little tired" upon the conclusion of treatment. She continues to require skilled physical therapy to address her remaining impairments to maximize her safety and functional mobility.   OBJECTIVE IMPAIRMENTS: Abnormal gait, decreased balance, decreased mobility, difficulty walking, decreased strength, and postural dysfunction.   ACTIVITY LIMITATIONS: carrying, lifting, bending, standing, stairs, transfers, and locomotion level  PARTICIPATION LIMITATIONS: shopping, community activity, and yard work  PERSONAL FACTORS: Past/current experiences, Time since onset of injury/illness/exacerbation, Transportation, and 1 comorbidity: Dementia  are also affecting patient's functional outcome.   REHAB POTENTIAL: Good  CLINICAL DECISION MAKING: Evolving/moderate complexity  EVALUATION COMPLEXITY: Moderate   GOALS: Goals reviewed with patient? Yes  SHORT TERM GOALS: Target date: 05/07/23 Patient will be able to complete her HEP with minimal assistance. Baseline: Goal status: MET  2.  Patient will improve her 5 times sit to stand time to 20  seconds or less for improved lower extremity power. Baseline: 7/5: 18.87 Goal status: MET  3.  Patient will improve her timed up and go to 14 seconds or less for improved functional mobility. Baseline: 7/5: 14.3 seconds Goal status: MET  LONG TERM GOALS: Target date: 05/28/23  Patient will be able to complete her advanced HEP with minimal assistance. Baseline:  Goal status: IN PROGRESS  2.  Patient  will improve her timed up and go time to 12 seconds or less to reduce her fall risk. Baseline: 7/5: 14.3 seconds Goal status: IN PROGRESS  3.  Patient will improve her 5 times sit to stand time to 14 seconds or less to reduce her fall risk. Baseline: 7/5: 18.87 seconds  Goal status: IN PROGRESS  4.  Patient will be able to safely pick up an item from the floor without external assistance for improved function with household tasks. Baseline:  Goal status: IN PROGRESS  PLAN:  PT FREQUENCY: 2x/week  PT DURATION: 6 weeks  PLANNED INTERVENTIONS: Therapeutic exercises, Therapeutic activity, Neuromuscular re-education, Balance training, Gait training, Patient/Family education, Self Care, Stair training, and Re-evaluation  PLAN FOR NEXT SESSION: nustep, balance interventions (ankle, hip, and stepping strategies), and focus on lower extremity power   Granville Lewis, PT 05/05/2023, 12:26 PM

## 2023-05-07 ENCOUNTER — Ambulatory Visit: Payer: Medicare HMO | Admitting: Physical Therapy

## 2023-05-07 ENCOUNTER — Encounter: Payer: Self-pay | Admitting: Physical Therapy

## 2023-05-07 DIAGNOSIS — Z9181 History of falling: Secondary | ICD-10-CM

## 2023-05-07 NOTE — Therapy (Signed)
OUTPATIENT PHYSICAL THERAPY LOWER EXTREMITY TREATMENT   Patient Name: Kristine Carson MRN: [id number] DOB:12-10-1945, 77 y.o., female Today's Date: 05/07/2023  END OF SESSION:  PT End of Session - 05/07/23 0858     Visit Number 7    Number of Visits 12    Date for PT Re-Evaluation 06/27/23    PT Start Time 0847    PT Stop Time 0930    PT Time Calculation (min) 43 min    Activity Tolerance Patient tolerated treatment well    Behavior During Therapy [facility] for tasks assessed/performed            Past Medical History:  Diagnosis Date   Anxiety    High cholesterol    Hypertension    Thyroid disease    History reviewed. No pertinent surgical history. There are no problems to display for this patient.  REFERRING PROVIDER: Teena Irani, PA-C   REFERRING DIAG: History of falling   THERAPY DIAG:  History of falling  Rationale for Evaluation and Treatment: Rehabilitation  ONSET DATE: 3 years ago  SUBJECTIVE:   SUBJECTIVE STATEMENT: Put on socks this morning as she says that her big toe was hurting this morning.  PERTINENT HISTORY: Dementia  PAIN:  Are you having pain? No complete pain screening provided  PRECAUTIONS: Fall  WEIGHT BEARING RESTRICTIONS: No  FALLS:  Has patient fallen in last 6 months? Yes. Number of falls "a couple"   PATIENT GOALS: improved ability to transfer, be able to clean her house, and be able to pick up items from the floor  NEXT MD VISIT: 07/02/23  OBJECTIVE: all objective measures were assessed at her initial evaluation on 04/16/23 unless otherwise noted  COGNITION: Overall cognitive status: Within functional limits for tasks assessed     SENSATION: Patient reports rare numbness in her right hand when she sleeps.   EDEMA:  No edema observed  POSTURE: rounded shoulders and forward head  LOWER EXTREMITY ROM: WFL for activities assessed  LOWER EXTREMITY MMT:  MMT Right eval Left eval  Hip flexion 4/5 4/5  Hip extension     Hip abduction    Hip adduction    Hip internal rotation    Hip external rotation    Knee flexion 4+/5 4+/5  Knee extension 4+/5 4+/5  Ankle dorsiflexion 4/5 4/5  Ankle plantarflexion    Ankle inversion    Ankle eversion     (Blank rows = not tested)  FUNCTIONAL TESTS:  5 times sit to stand: 25.40 seconds without UE support (uncontrolled descent on 1 rep)  Timed up and go (TUG): 16.46 seconds required minA for safety with sit to stand and stand to sit transfers  GAIT: Assistive device utilized: None Level of assistance: SBA Comments: decreased gait speed with poor foot clearance and increased lateral sway   TODAY'S TREATMENT:  DATE:    05/07/23 EXERCISE LOG  Exercise Repetitions and Resistance Comments  Nustep  L4 x 18 minutes BUE and BLE  Toe taps  6" box x20 reps   NBOS on airex X3 min   Semitandem airex X3 min   SLS, foot on step SLS foot on airex x2 min each   LAQ 5# x 30 reps each   STS X20 reps no UE support    Blank cell = exercise not performed today   PATIENT EDUCATION:  Education details: Plan of care, prognosis, objective findings, and goals for therapy Person educated: Patient and Spouse Education method: Explanation Education comprehension: verbalized understanding  HOME EXERCISE PROGRAM:  ASSESSMENT:  CLINICAL IMPRESSION: Patient presented in clinic with reports of great toe pain prior to PT. No new complaints while in PT clinic. Patient has noted improvement in LE strength since starting PT sessions. Patient challenged with uneven surfaces as well as limited UE support. Dual tasking/conversation with head turns completed during balance training. Patient fatigued following end of session.  OBJECTIVE IMPAIRMENTS: Abnormal gait, decreased balance, decreased mobility, difficulty walking, decreased strength, and postural dysfunction.    ACTIVITY LIMITATIONS: carrying, lifting, bending, standing, stairs, transfers, and locomotion level  PARTICIPATION LIMITATIONS: shopping, community activity, and yard work  PERSONAL FACTORS: Past/current experiences, Time since onset of injury/illness/exacerbation, Transportation, and 1 comorbidity: Dementia  are also affecting patient's functional outcome.   REHAB POTENTIAL: Good  CLINICAL DECISION MAKING: Evolving/moderate complexity  EVALUATION COMPLEXITY: Moderate  GOALS: Goals reviewed with patient? Yes  SHORT TERM GOALS: Target date: 05/07/23 Patient will be able to complete her HEP with minimal assistance. Baseline: Goal status: MET  2.  Patient will improve her 5 times sit to stand time to 20 seconds or less for improved lower extremity power. Baseline: 7/5: 18.87 Goal status: MET  3.  Patient will improve her timed up and go to 14 seconds or less for improved functional mobility. Baseline: 7/5: 14.3 seconds Goal status: MET  LONG TERM GOALS: Target date: 05/28/23  Patient will be able to complete her advanced HEP with minimal assistance. Baseline:  Goal status: IN PROGRESS  2.  Patient will improve her timed up and go time to 12 seconds or less to reduce her fall risk. Baseline: 7/5: 14.3 seconds Goal status: IN PROGRESS  3.  Patient will improve her 5 times sit to stand time to 14 seconds or less to reduce her fall risk. Baseline: 7/5: 18.87 seconds  Goal status: IN PROGRESS  4.  Patient will be able to safely pick up an item from the floor without external assistance for improved function with household tasks. Baseline:  Goal status: IN PROGRESS  PLAN:  PT FREQUENCY: 2x/week  PT DURATION: 6 weeks  PLANNED INTERVENTIONS: Therapeutic exercises, Therapeutic activity, Neuromuscular re-education, Balance training, Gait training, Patient/Family education, Self Care, Stair training, and Re-evaluation  PLAN FOR NEXT SESSION: nustep, balance interventions  (ankle, hip, and stepping strategies), and focus on lower extremity power  Tyron Russell Eland Lamantia, PTA 05/07/2023, 11:22 AM

## 2023-05-13 ENCOUNTER — Ambulatory Visit: Payer: Medicare HMO

## 2023-05-13 DIAGNOSIS — Z9181 History of falling: Secondary | ICD-10-CM

## 2023-05-13 NOTE — Therapy (Signed)
OUTPATIENT PHYSICAL THERAPY LOWER EXTREMITY TREATMENT   Patient Name: SERRENA LINDERMAN MRN: [id number] DOB:01-11-1946, 77 y.o., female Today's Date: 05/13/2023  END OF SESSION:  PT End of Session - 05/13/23 0846     Visit Number 8    Number of Visits 12    Date for PT Re-Evaluation 06/27/23    PT Start Time 0845    PT Stop Time 0928    PT Time Calculation (min) 43 min    Activity Tolerance Patient tolerated treatment well    Behavior During Therapy [facility] for tasks assessed/performed            Past Medical History:  Diagnosis Date   Anxiety    High cholesterol    Hypertension    Thyroid disease    History reviewed. No pertinent surgical history. There are no problems to display for this patient.  REFERRING PROVIDER: Teena Irani, PA-C   REFERRING DIAG: History of falling   THERAPY DIAG:  History of falling  Rationale for Evaluation and Treatment: Rehabilitation  ONSET DATE: 3 years ago  SUBJECTIVE:   SUBJECTIVE STATEMENT: Pt denies any pain today.   PERTINENT HISTORY: Dementia  PAIN:  Are you having pain? No complete pain screening provided  PRECAUTIONS: Fall  WEIGHT BEARING RESTRICTIONS: No  FALLS:  Has patient fallen in last 6 months? Yes. Number of falls "a couple"   PATIENT GOALS: improved ability to transfer, be able to clean her house, and be able to pick up items from the floor  NEXT MD VISIT: 07/02/23  OBJECTIVE: all objective measures were assessed at her initial evaluation on 04/16/23 unless otherwise noted  COGNITION: Overall cognitive status: Within functional limits for tasks assessed     SENSATION: Patient reports rare numbness in her right hand when she sleeps.   EDEMA:  No edema observed  POSTURE: rounded shoulders and forward head  LOWER EXTREMITY ROM: WFL for activities assessed  LOWER EXTREMITY MMT:  MMT Right eval Left eval  Hip flexion 4/5 4/5  Hip extension    Hip abduction    Hip adduction    Hip internal  rotation    Hip external rotation    Knee flexion 4+/5 4+/5  Knee extension 4+/5 4+/5  Ankle dorsiflexion 4/5 4/5  Ankle plantarflexion    Ankle inversion    Ankle eversion     (Blank rows = not tested)  FUNCTIONAL TESTS:  5 times sit to stand: 25.40 seconds without UE support (uncontrolled descent on 1 rep)  Timed up and go (TUG): 16.46 seconds required minA for safety with sit to stand and stand to sit transfers  GAIT: Assistive device utilized: None Level of assistance: SBA Comments: decreased gait speed with poor foot clearance and increased lateral sway   TODAY'S TREATMENT:  DATE:    05/13/23 EXERCISE LOG  Exercise Repetitions and Resistance Comments  Nustep  L4 x 18 minutes BUE and BLE  Toe taps  8" box x 2 mins   NBOS on airex 3.5 min   Semitandem airex 3.5 min   Rockerboard 3 mins   Lunges BOSU ball up, 4 mins   Static Balance BOSU ball down, 2 mins   SLS, foot on step SLS foot on airex x2 min each   LAQ 5# x 30 reps each   STS X20 reps no UE support    Blank cell = exercise not performed today   PATIENT EDUCATION:  Education details: Plan of care, prognosis, objective findings, and goals for therapy Person educated: Patient and Spouse Education method: Explanation Education comprehension: verbalized understanding  HOME EXERCISE PROGRAM:  ASSESSMENT:  CLINICAL IMPRESSION: Pt arrives for today's treatment session denying any pain.  Pt introduced to BOSU ball balance exercises today.  Pt requiring intermittent BUE support with all BOSU ball activities.  Pt able to tolerate increased time or reps with all previously performed exercises with minimal fatigue.  Pt denied any pain at completion of today's treatment session.  OBJECTIVE IMPAIRMENTS: Abnormal gait, decreased balance, decreased mobility, difficulty walking, decreased strength, and  postural dysfunction.   ACTIVITY LIMITATIONS: carrying, lifting, bending, standing, stairs, transfers, and locomotion level  PARTICIPATION LIMITATIONS: shopping, community activity, and yard work  PERSONAL FACTORS: Past/current experiences, Time since onset of injury/illness/exacerbation, Transportation, and 1 comorbidity: Dementia  are also affecting patient's functional outcome.   REHAB POTENTIAL: Good  CLINICAL DECISION MAKING: Evolving/moderate complexity  EVALUATION COMPLEXITY: Moderate  GOALS: Goals reviewed with patient? Yes  SHORT TERM GOALS: Target date: 05/07/23 Patient will be able to complete her HEP with minimal assistance. Baseline: Goal status: MET  2.  Patient will improve her 5 times sit to stand time to 20 seconds or less for improved lower extremity power. Baseline: 7/5: 18.87 Goal status: MET  3.  Patient will improve her timed up and go to 14 seconds or less for improved functional mobility. Baseline: 7/5: 14.3 seconds Goal status: MET  LONG TERM GOALS: Target date: 05/28/23  Patient will be able to complete her advanced HEP with minimal assistance. Baseline:  Goal status: IN PROGRESS  2.  Patient will improve her timed up and go time to 12 seconds or less to reduce her fall risk. Baseline: 7/5: 14.3 seconds Goal status: IN PROGRESS  3.  Patient will improve her 5 times sit to stand time to 14 seconds or less to reduce her fall risk. Baseline: 7/5: 18.87 seconds  Goal status: IN PROGRESS  4.  Patient will be able to safely pick up an item from the floor without external assistance for improved function with household tasks. Baseline:  Goal status: IN PROGRESS  PLAN:  PT FREQUENCY: 2x/week  PT DURATION: 6 weeks  PLANNED INTERVENTIONS: Therapeutic exercises, Therapeutic activity, Neuromuscular re-education, Balance training, Gait training, Patient/Family education, Self Care, Stair training, and Re-evaluation  PLAN FOR NEXT SESSION: nustep,  balance interventions (ankle, hip, and stepping strategies), and focus on lower extremity power  Newman Pies, PTA 05/13/2023, 9:45 AM

## 2023-05-15 ENCOUNTER — Encounter: Payer: Self-pay | Admitting: Physical Therapy

## 2023-05-15 ENCOUNTER — Ambulatory Visit: Payer: Medicare HMO | Admitting: Physical Therapy

## 2023-05-15 DIAGNOSIS — Z9181 History of falling: Secondary | ICD-10-CM

## 2023-05-15 NOTE — Therapy (Signed)
OUTPATIENT PHYSICAL THERAPY LOWER EXTREMITY TREATMENT   Patient Name: Kristine Carson MRN: [id number] DOB:April 23, 1946, 77 y.o., female Today's Date: 05/15/2023  END OF SESSION:  PT End of Session - 05/15/23 0848     Visit Number 9    Number of Visits 12    Date for PT Re-Evaluation 06/27/23    PT Start Time 0847    PT Stop Time 0933    PT Time Calculation (min) 46 min    Activity Tolerance Patient tolerated treatment well    Behavior During Therapy [facility] for tasks assessed/performed            Past Medical History:  Diagnosis Date   Anxiety    High cholesterol    Hypertension    Thyroid disease    History reviewed. No pertinent surgical history. There are no problems to display for this patient.  REFERRING PROVIDER: Teena Irani, PA-C   REFERRING DIAG: History of falling   THERAPY DIAG:  History of falling  Rationale for Evaluation and Treatment: Rehabilitation  ONSET DATE: 3 years ago  SUBJECTIVE:   SUBJECTIVE STATEMENT: Pt denies any pain today.   PERTINENT HISTORY: Dementia  PAIN:  Are you having pain? No complete pain screening provided  PRECAUTIONS: Fall  WEIGHT BEARING RESTRICTIONS: No  FALLS:  Has patient fallen in last 6 months? Yes. Number of falls "a couple"   PATIENT GOALS: improved ability to transfer, be able to clean her house, and be able to pick up items from the floor  NEXT MD VISIT: 07/02/23  OBJECTIVE: all objective measures were assessed at her initial evaluation on 04/16/23 unless otherwise noted  COGNITION: Overall cognitive status: Within functional limits for tasks assessed     SENSATION: Patient reports rare numbness in her right hand when she sleeps.   EDEMA:  No edema observed  POSTURE: rounded shoulders and forward head  LOWER EXTREMITY ROM: WFL for activities assessed  LOWER EXTREMITY MMT:  MMT Right eval Left eval  Hip flexion 4/5 4/5  Hip extension    Hip abduction    Hip adduction    Hip internal  rotation    Hip external rotation    Knee flexion 4+/5 4+/5  Knee extension 4+/5 4+/5  Ankle dorsiflexion 4/5 4/5  Ankle plantarflexion    Ankle inversion    Ankle eversion     (Blank rows = not tested)  FUNCTIONAL TESTS:  5 times sit to stand: 25.40 seconds without UE support (uncontrolled descent on 1 rep)  Timed up and go (TUG): 16.46 seconds required minA for safety with sit to stand and stand to sit transfers  GAIT: Assistive device utilized: None Level of assistance: SBA Comments: decreased gait speed with poor foot clearance and increased lateral sway   TODAY'S TREATMENT:  DATE:    05/15/23 EXERCISE LOG  Exercise Repetitions and Resistance Comments  Nustep  L4 x 17 minutes BUE and BLE  Toe taps  8" box x 2 mins   NBOS on airex 3 min; x2 min with head turns   Semitandem airex 3 min   Static Balance On beam x4 min   Heel raises X20 reps   Toe raises X20 reps   Hip abduction X20 reps    Sidestepping on beam X6 reps    Blank cell = exercise not performed today   PATIENT EDUCATION:  Education details: Plan of care, prognosis, objective findings, and goals for therapy Person educated: Patient and Spouse Education method: Explanation Education comprehension: verbalized understanding  HOME EXERCISE PROGRAM:  ASSESSMENT:  CLINICAL IMPRESSION: Patient presented in clinic with no new complaints other than using new shoes today. Patient able to tolerate therex and strengthening well with no complaints or reports of fatigue. Patient progressed through various BOS and on uneven surfaces. Various distractions utilized such as conversation and recollection.  OBJECTIVE IMPAIRMENTS: Abnormal gait, decreased balance, decreased mobility, difficulty walking, decreased strength, and postural dysfunction.   ACTIVITY LIMITATIONS: carrying, lifting, bending,  standing, stairs, transfers, and locomotion level  PARTICIPATION LIMITATIONS: shopping, community activity, and yard work  PERSONAL FACTORS: Past/current experiences, Time since onset of injury/illness/exacerbation, Transportation, and 1 comorbidity: Dementia  are also affecting patient's functional outcome.   REHAB POTENTIAL: Good  CLINICAL DECISION MAKING: Evolving/moderate complexity  EVALUATION COMPLEXITY: Moderate  GOALS: Goals reviewed with patient? Yes  SHORT TERM GOALS: Target date: 05/07/23 Patient will be able to complete her HEP with minimal assistance. Baseline: Goal status: MET  2.  Patient will improve her 5 times sit to stand time to 20 seconds or less for improved lower extremity power. Baseline: 7/5: 18.87 Goal status: MET  3.  Patient will improve her timed up and go to 14 seconds or less for improved functional mobility. Baseline: 7/5: 14.3 seconds Goal status: MET  LONG TERM GOALS: Target date: 05/28/23  Patient will be able to complete her advanced HEP with minimal assistance. Baseline:  Goal status: IN PROGRESS  2.  Patient will improve her timed up and go time to 12 seconds or less to reduce her fall risk. Baseline: 7/5: 14.3 seconds Goal status: IN PROGRESS  3.  Patient will improve her 5 times sit to stand time to 14 seconds or less to reduce her fall risk. Baseline: 7/5: 18.87 seconds  Goal status: IN PROGRESS  4.  Patient will be able to safely pick up an item from the floor without external assistance for improved function with household tasks. Baseline:  Goal status: IN PROGRESS  PLAN:  PT FREQUENCY: 2x/week  PT DURATION: 6 weeks  PLANNED INTERVENTIONS: Therapeutic exercises, Therapeutic activity, Neuromuscular re-education, Balance training, Gait training, Patient/Family education, Self Care, Stair training, and Re-evaluation  PLAN FOR NEXT SESSION: nustep, balance interventions (ankle, hip, and stepping strategies), and focus on lower  extremity power  Marvell Fuller, PTA 05/15/2023, 9:42 AM

## 2023-05-20 ENCOUNTER — Ambulatory Visit: Payer: Medicare HMO

## 2023-05-20 DIAGNOSIS — Z9181 History of falling: Secondary | ICD-10-CM

## 2023-05-20 NOTE — Therapy (Addendum)
OUTPATIENT PHYSICAL THERAPY LOWER EXTREMITY TREATMENT   Patient Name: DAMIYAH Carson MRN: [id number] DOB:August 17, 1946, 77 y.o., female Today's Date: 05/20/2023  END OF SESSION:  PT End of Session - 05/20/23 0849     Visit Number 10    Number of Visits 12    Date for PT Re-Evaluation 06/27/23    PT Start Time 0845    PT Stop Time 0925    PT Time Calculation (min) 40 min    Activity Tolerance Patient tolerated treatment well    Behavior During Therapy [facility] for tasks assessed/performed             Past Medical History:  Diagnosis Date   Anxiety    High cholesterol    Hypertension    Thyroid disease    History reviewed. No pertinent surgical history. There are no problems to display for this patient.  REFERRING PROVIDER: Teena Irani, PA-C   REFERRING DIAG: History of falling   THERAPY DIAG:  History of falling  Rationale for Evaluation and Treatment: Rehabilitation  ONSET DATE: 3 years ago  SUBJECTIVE:   SUBJECTIVE STATEMENT: Patient reports that she feels like she has gotten a lot better. She feels like she is ready to start back walking at her church as she had stopped doing this due to her fear of falling. She feels comfortable seeing how she is doing without coming back to physical therapy.   PERTINENT HISTORY: Dementia  PAIN:  Are you having pain? No complete pain screening provided  PRECAUTIONS: Fall  WEIGHT BEARING RESTRICTIONS: No  FALLS:  Has patient fallen in last 6 months? Yes. Number of falls "a couple"   PATIENT GOALS: improved ability to transfer, be able to clean her house, and be able to pick up items from the floor  NEXT MD VISIT: 07/02/23  OBJECTIVE: all objective measures were assessed at her initial evaluation on 04/16/23 unless otherwise noted  COGNITION: Overall cognitive status: Within functional limits for tasks assessed     SENSATION: Patient reports rare numbness in her right hand when she sleeps.   EDEMA:  No edema  observed  POSTURE: rounded shoulders and forward head  LOWER EXTREMITY ROM: WFL for activities assessed  LOWER EXTREMITY MMT:  MMT Right eval Left eval  Hip flexion 4/5 4/5  Hip extension    Hip abduction    Hip adduction    Hip internal rotation    Hip external rotation    Knee flexion 4+/5 4+/5  Knee extension 4+/5 4+/5  Ankle dorsiflexion 4/5 4/5  Ankle plantarflexion    Ankle inversion    Ankle eversion     (Blank rows = not tested)  FUNCTIONAL TESTS:  5 times sit to stand: 25.40 seconds without UE support (uncontrolled descent on 1 rep)  Timed up and go (TUG): 16.46 seconds required minA for safety with sit to stand and stand to sit transfers  GAIT: Assistive device utilized: None Level of assistance: SBA Comments: decreased gait speed with poor foot clearance and increased lateral sway   TODAY'S TREATMENT:  DATE:                                   05/20/23 EXERCISE LOG  Exercise Repetitions and Resistance Comments  Nustep  L4 x 15 minutes   Standing heel raise  3 minutes   NBOS on airex  3 minutes  With dual tasking  Marching on foam  3 minutes            Blank cell = exercise not performed today       05/15/23 EXERCISE LOG  Exercise Repetitions and Resistance Comments  Nustep  L4 x 17 minutes BUE and BLE  Toe taps  8" box x 2 mins   NBOS on airex 3 min; x2 min with head turns   Semitandem airex 3 min   Static Balance On beam x4 min   Heel raises X20 reps   Toe raises X20 reps   Hip abduction X20 reps    Sidestepping on beam X6 reps    Blank cell = exercise not performed today   PATIENT EDUCATION:  Education details: plan of care, objective findings, benefits of walking, and HEP  Person educated: Patient and Spouse Education method: Explanation Education comprehension: verbalized understanding  HOME EXERCISE  PROGRAM:  ASSESSMENT:  CLINICAL IMPRESSION: Patient is making good progress with skilled physical therapy as evidenced by her subjective reports, objective measures, functional mobility, and progress toward her goals. She was able to meet all of her short term goals for therapy at this time. She was able demonstrate improved safety when picking up items from the floor. However, she was unable to completely meet her long term timed up and go and five time sit to stand goals. Her HEP was reviewed and she was educated on the importance of continuing with these interventions. She reported feeling comfortable being placed on hold at this time to evaluate the effectiveness of her HEP.    PHYSICAL THERAPY DISCHARGE SUMMARY  Visits from Start of Care: 10  Current functional level related to goals / functional outcomes: Patient was able to meet all of her short term and part of her long term goals.    Remaining deficits: Lower extremity power   Education / Equipment: HEP    Patient agrees to discharge. Patient goals were partially met. Patient is being discharged due to being pleased with the current functional level.  Candi Leash, PT, DPT    OBJECTIVE IMPAIRMENTS: Abnormal gait, decreased balance, decreased mobility, difficulty walking, decreased strength, and postural dysfunction.   ACTIVITY LIMITATIONS: carrying, lifting, bending, standing, stairs, transfers, and locomotion level  PARTICIPATION LIMITATIONS: shopping, community activity, and yard work  PERSONAL FACTORS: Past/current experiences, Time since onset of injury/illness/exacerbation, Transportation, and 1 comorbidity: Dementia  are also affecting patient's functional outcome.   REHAB POTENTIAL: Good  CLINICAL DECISION MAKING: Evolving/moderate complexity  EVALUATION COMPLEXITY: Moderate  GOALS: Goals reviewed with patient? Yes  SHORT TERM GOALS: Target date: 05/07/23 Patient will be able to complete her HEP with minimal  assistance. Baseline: Goal status: MET  2.  Patient will improve her 5 times sit to stand time to 20 seconds or less for improved lower extremity power. Baseline: 7/5: 18.87 Goal status: MET  3.  Patient will improve her timed up and go to 14 seconds or less for improved functional mobility. Baseline: 7/5: 14.3 seconds Goal status: MET  LONG TERM GOALS: Target date: 05/28/23  Patient will be able  to complete her advanced HEP with minimal assistance. Baseline:  Goal status: MET  2.  Patient will improve her timed up and go time to 12 seconds or less to reduce her fall risk. Baseline: 7/5: 14.3 seconds 05/20/23: 13.55 seconds Goal status: IN PROGRESS  3.  Patient will improve her 5 times sit to stand time to 14 seconds or less to reduce her fall risk. Baseline: 7/5: 18.87 seconds 05/20/23: 16 seconds Goal status: IN PROGRESS  4.  Patient will be able to safely pick up an item from the floor without external assistance for improved function with household tasks. Baseline:  Goal status: MET  PLAN:  PT FREQUENCY: 2x/week  PT DURATION: 6 weeks  PLANNED INTERVENTIONS: Therapeutic exercises, Therapeutic activity, Neuromuscular re-education, Balance training, Gait training, Patient/Family education, Self Care, Stair training, and Re-evaluation  PLAN FOR NEXT SESSION: potential discharge  Granville Lewis, PT 05/20/2023, 9:51 AM

## 2023-05-22 ENCOUNTER — Encounter: Payer: Medicare HMO | Admitting: Physical Therapy

## 2023-05-27 ENCOUNTER — Ambulatory Visit: Payer: Medicare HMO | Admitting: Physical Therapy

## 2023-06-06 ENCOUNTER — Other Ambulatory Visit: Payer: Self-pay | Admitting: Physician Assistant

## 2023-06-06 DIAGNOSIS — Z1231 Encounter for screening mammogram for malignant neoplasm of breast: Secondary | ICD-10-CM

## 2023-06-23 ENCOUNTER — Ambulatory Visit
Admission: RE | Admit: 2023-06-23 | Discharge: 2023-06-23 | Disposition: A | Discharge status: Home / Self Care | Payer: Medicare HMO | Source: Ambulatory Visit | Attending: Physician Assistant | Admitting: Physician Assistant

## 2023-06-23 DIAGNOSIS — Z1231 Encounter for screening mammogram for malignant neoplasm of breast: Secondary | ICD-10-CM

## 2023-07-01 ENCOUNTER — Encounter: Payer: Self-pay | Admitting: Physician Assistant

## 2023-07-01 ENCOUNTER — Ambulatory Visit (INDEPENDENT_AMBULATORY_CARE_PROVIDER_SITE_OTHER): Payer: Medicare HMO | Admitting: Physician Assistant

## 2023-07-01 VITALS — BP 108/60 | HR 82 | Ht 66.0 in | Wt 144.4 lb

## 2023-07-01 DIAGNOSIS — R12 Heartburn: Secondary | ICD-10-CM | POA: Diagnosis not present

## 2023-07-01 DIAGNOSIS — K5900 Constipation, unspecified: Secondary | ICD-10-CM

## 2023-07-01 NOTE — Patient Instructions (Signed)
_______________________________________________________  If your blood pressure at your visit was 140/90 or greater, please contact your primary care physician to follow up on this.  _______________________________________________________  If you are age 76 or older, your body mass index should be between 23-30. Your Body mass index is 23.31 kg/m. If this is out of the aforementioned range listed, please consider follow up with your Primary Care Provider.  If you are age 49 or younger, your body mass index should be between 19-25. Your Body mass index is 23.31 kg/m. If this is out of the aformentioned range listed, please consider follow up with your Primary Care Provider.   ________________________________________________________  The  GI providers would like to encourage you to use [facility] to communicate with providers for non-urgent requests or questions.  Due to long hold times on the telephone, sending your provider a message by [facility] may be a faster and more efficient way to get a response.  Please allow 48 business hours for a response.  Please remember that this is for non-urgent requests.  _______________________________________________________

## 2023-07-01 NOTE — Progress Notes (Signed)
Chief Complaint: Change in bowel habits  HPI:    Kristine Carson is a 77 year old female with a past medical history as listed below including anxiety and thyroid disease, who was referred to me by Teena Irani, PA-C for a complaint of change in bowel habits.     07/25/2022 CBC and CMP normal.    Today, patient presents to clinic and tells me she is not even really sure why she is here.  Apparently had some trouble with a change in bowel habits towards constipation with some straining etc., but she thinks this is related to her diverticulosis and so she really went strict on no kernels or nuts or seeds and now everything is back to normal.  She is very happy because she is having a bowel movement every morning and feels very regular with no abdominal pain or discomfort in between.  She also started an over-the-counter probiotic a month ago which she feels like helps.  Over the past months she is normal.  No new complaints or concerns.    Does describe very occasional heartburn/reflux if she eats too late at night.    Describes 2 prior colonoscopies, it looks like these may have been done [place].    Denies fever, chills, weight loss, blood in her stool, nausea, vomiting or symptoms that awaken her from sleep.  Past Medical History:  Diagnosis Date   Anxiety    High cholesterol    Hypertension    Thyroid disease     Past Surgical History:  Procedure Laterality Date   ABDOMINAL HYSTERECTOMY     CHOLECYSTECTOMY      Current Outpatient Medications  Medication Sig Dispense Refill   buPROPion (WELLBUTRIN XL) 300 MG 24 hr tablet Take 300 mg by mouth daily.     diclofenac Sodium (VOLTAREN) 1 % GEL Apply topically as needed.     donepezil (ARICEPT) 10 MG tablet Take 1 tablet by mouth daily.     famotidine (PEPCID) 20 MG tablet Take 20 mg by mouth daily.     fenofibrate 160 MG tablet Take 160 mg by mouth daily.     levothyroxine (SYNTHROID) 112 MCG tablet Take 112 mcg by mouth daily.      lisinopril-hydrochlorothiazide (ZESTORETIC) 10-12.5 MG tablet Take 1 tablet by mouth daily.     MULTIPLE VITAMIN PO Take 1 tablet by mouth daily at 6 (six) AM.     REPATHA SURECLICK 140 MG/ML SOAJ Inject 140 mg into the skin every 14 (fourteen) days.     sertraline (ZOLOFT) 100 MG tablet Take 2 tablets by mouth daily.     Vitamin D, Ergocalciferol, (DRISDOL) 1.25 MG (50000 UNIT) CAPS capsule Take 50,000 Units by mouth every 7 (seven) days.     No current facility-administered medications for this visit.    Allergies as of 07/01/2023 - Review Complete 07/01/2023  Allergen Reaction Noted   Pyridium [phenazopyridine hcl]  09/14/2016   Sulfa antibiotics  09/14/2016    Family History  Problem Relation Age of Onset   Hypertension Mother    Heart disease Father    Lung cancer Sister    Pancreatic cancer Brother     Social History   Socioeconomic History   Marital status: Married    Spouse name: Not on file   Number of children: 3   Years of education: Not on file   Highest education level: Not on file  Occupational History   Not on file  Tobacco Use   Smoking status:  Never   Smokeless tobacco: Never  Substance and Sexual Activity   Alcohol use: No   Drug use: No   Sexual activity: Not on file  Other Topics Concern   Not on file  Social History Narrative   Not on file   Social Determinants of Health   Financial Resource Strain: Low Risk  (12/30/2022)   Received from [facility], [facility]   Overall Financial Resource Strain (CARDIA)    Difficulty of Paying Living Expenses: Not very hard  Food Insecurity: No Food Insecurity (12/30/2022)   Received from [facility], [facility]   Hunger Vital Sign    Worried About Running Out of Food in the Last Year: Never true    Ran Out of Food in the Last Year: Never true  Transportation Needs: No Transportation Needs (12/30/2022)   Received from [facility], [facility]   PRAPARE - Transportation    Lack of Transportation  (Medical): No    Lack of Transportation (Non-Medical): No  Physical Activity: Insufficiently Active (09/30/2022)   Received from [facility], [facility]   Exercise Vital Sign    Days of Exercise per Week: 2 days    Minutes of Exercise per Session: 20 min  Stress: No Stress Concern Present (09/30/2022)   Received from [facility], [facility] - Occupational Stress Questionnaire    Feeling of Stress : Only a little  Social Connections: Socially Integrated (09/30/2022)   Received from [facility], [facility]   Social Network    How would you rate your social network (family, work, friends)?: Good participation with social networks  Intimate Partner Violence: Not At Risk (09/30/2022)   Received from [facility], [facility]   HITS    Over the last 12 months how often did your partner physically hurt you?: 1    Over the last 12 months how often did your partner insult you or talk down to you?: 1    Over the last 12 months how often did your partner threaten you with physical harm?: 1    Over the last 12 months how often did your partner scream or curse at you?: 1    Review of Systems:    Constitutional: No weight loss, fever or chills Skin: No rash  Cardiovascular: No chest pain  Respiratory: No SOB  Gastrointestinal: See HPI and otherwise negative Genitourinary: No dysuria  Neurological: No headache, dizziness or syncope Musculoskeletal: No new muscle or joint pain Hematologic: No bleeding  Psychiatric: No history of depression or anxiety   Physical Exam:  Vital signs: BP 108/60   Pulse 82   Ht 5\' 6"  (1.676 m)   Wt 144 lb 6.4 oz (65.5 kg)   BMI 23.31 kg/m    Constitutional:   Pleasant Elderly Caucasian female appears to be in NAD, Well developed, Well nourished, alert and cooperative Head:  Normocephalic and atraumatic. Eyes:   PEERL, EOMI. No icterus. Conjunctiva pink. Ears:  Normal auditory acuity. Neck:   Supple Throat: Oral cavity and pharynx without inflammation, swelling or lesion.  Respiratory: Respirations even and unlabored. Lungs clear to auscultation bilaterally.   No wheezes, crackles, or rhonchi.  Cardiovascular: Normal S1, S2. No MRG. Regular rate and rhythm. No peripheral edema, cyanosis or pallor.  Gastrointestinal:  Soft, nondistended, nontender. No rebound or guarding. Normal bowel sounds. No appreciable masses or hepatomegaly. Rectal:  Not performed.  Msk:  Symmetrical without gross deformities. Without edema, no deformity or joint  abnormality.  Neurologic:  Alert and  oriented x4;  grossly normal neurologically.  Skin:   Dry and intact without significant lesions or rashes. Psychiatric: Demonstrates good judgement and reason without abnormal affect or behaviors.  See HPI for most recent labs.  Assessment: 1.  Constipation: Patient battled with constipation for a few months, but now this is back to normal since she is more closely watching what she eats 2.  Heartburn: Very occasionally she eats too late at night; likely reactive gastritis  Plan: 1.  At this point patient is doing well, would recommend she continue what she is doing with her probiotic and change in diet. 2.  Discussed very infrequent heartburn symptoms.  Discussed she could maybe try an over-the-counter Pepcid if she knows she is going to eat late at night. 3.  Will try and request records from [facility] to make sure she is up-to-date with colon cancer screening. 4.  Patient was assigned to Dr. Lavon Paganini this morning.  Hyacinth Meeker, PA-C [facility] 07/01/2023, 8:47 AM  Cc: Teena Irani, PA-C

## 2023-07-22 ENCOUNTER — Ambulatory Visit: Payer: Medicare HMO | Attending: Physician Assistant

## 2023-07-22 ENCOUNTER — Other Ambulatory Visit: Payer: Self-pay

## 2023-07-22 DIAGNOSIS — Z9181 History of falling: Secondary | ICD-10-CM | POA: Insufficient documentation

## 2023-07-22 DIAGNOSIS — M6281 Muscle weakness (generalized): Secondary | ICD-10-CM | POA: Diagnosis present

## 2023-07-22 NOTE — Therapy (Signed)
OUTPATIENT PHYSICAL THERAPY LOWER EXTREMITY EVALUATION   Patient Name: Kristine Carson MRN: [id number] DOB:1946-09-06, 77 y.o., female Today's Date: 07/22/2023  END OF SESSION:  PT End of Session - 07/22/23 0852     Visit Number 1    Number of Visits 8    Date for PT Re-Evaluation 09/26/23    PT Start Time 0853    PT Stop Time 0913    PT Time Calculation (min) 20 min    Activity Tolerance Patient tolerated treatment well    Behavior During Therapy [facility] for tasks assessed/performed             Past Medical History:  Diagnosis Date   Anxiety    High cholesterol    Hypertension    Thyroid disease    Past Surgical History:  Procedure Laterality Date   ABDOMINAL HYSTERECTOMY     CHOLECYSTECTOMY     There are no problems to display for this patient.  REFERRING PROVIDER: Dani Gobble, PA-C   REFERRING DIAG: Muscular deconditioning   THERAPY DIAG:  Muscle weakness (generalized)  Rationale for Evaluation and Treatment: Rehabilitation  ONSET DATE: 05/20/23  SUBJECTIVE:   SUBJECTIVE STATEMENT: Patient feels that she has been doing really good since finishing her last episode of physical therapy on 05/20/23. However, she feels that both legs feel weak and that the may give out on her while walking. She has not fallen, but she fells that she might lose her balance. She has tried to be more careful when walking around.   PERTINENT HISTORY: Anxiety, hypertension, and dementia  PAIN:  Are you having pain? No  PRECAUTIONS: None  RED FLAGS: None   WEIGHT BEARING RESTRICTIONS: No  FALLS:  Has patient fallen in last 6 months? No  LIVING ENVIRONMENT: Lives with: lives with their spouse Lives in: House/apartment Stairs: Yes: External: 3 steps; can reach both; step to pattern Has following equipment at home: None  OCCUPATION: retired  PLOF: Independent  PATIENT GOALS: improved strength in her legs and improved stability and confidence walking (be able to  walk around her church track (0.25 miles per lap))  NEXT MD VISIT: 11/02/22  OBJECTIVE:   COGNITION: Overall cognitive status: Within functional limits for tasks assessed     SENSATION: Patient reports no numbness or tingling  EDEMA:  No edema observed  POSTURE: forward head  LOWER EXTREMITY ROM: WFL for activities assessed  LOWER EXTREMITY MMT:  MMT Right eval Left eval  Hip flexion 4+/5 4/5  Hip extension    Hip abduction    Hip adduction    Hip internal rotation    Hip external rotation    Knee flexion 4/5 4/5  Knee extension 4+/5 4+/5  Ankle dorsiflexion 4/5 4/5  Ankle plantarflexion    Ankle inversion    Ankle eversion     (Blank rows = not tested)  FUNCTIONAL TESTS:  5 times sit to stand: 17.11 seconds without UE support Timed up and go (TUG): 14.48 seconds  GAIT: Assistive device utilized: None Level of assistance: Complete Independence Comments: No significant gait deviations observed    TODAY'S TREATMENT:  DATE:     PATIENT EDUCATION:  Education details: Plan of care, prognosis, objective findings, and goals for therapy Person educated: Patient Education method: Explanation Education comprehension: verbalized understanding  HOME EXERCISE PROGRAM:   ASSESSMENT:  CLINICAL IMPRESSION: Patient is a 77 y.o. female who was seen today for physical therapy evaluation and treatment for deconditioning. She is also at an elevated risk of falling as evidenced by her objective measures such as her elevated timed up and go and 5 times sit to stand times. Recommend that she continue with skilled physical therapy to address her impairments to maximize her safety and functional mobility.  OBJECTIVE IMPAIRMENTS: decreased activity tolerance, decreased mobility, and decreased strength.   ACTIVITY LIMITATIONS: carrying, standing, stairs,  transfers, and locomotion level  PARTICIPATION LIMITATIONS: meal prep, cleaning, laundry, shopping, and community activity  PERSONAL FACTORS: Past/current experiences and 3+ comorbidities: Anxiety, hypertension, and dementia   are also affecting patient's functional outcome.   REHAB POTENTIAL: Good  CLINICAL DECISION MAKING: Stable/uncomplicated  EVALUATION COMPLEXITY: Low   GOALS: Goals reviewed with patient? Yes  LONG TERM GOALS: Target date: 08/19/23  Patient will be independent with her HEP. Baseline:  Goal status: INITIAL  2.  Patient will improve her 5 times sit to stand time to 12 seconds or less for an approved lower extremity power. Baseline:  Goal status: INITIAL  3.  Patient will improve her timed up and go time to 12 seconds or less to reduce her fall risk. Baseline:  Goal status: INITIAL  4.  Patient will report being able to walk at least 2 laps at her church without being limited by her familiar symptoms of weakness. Baseline:  Goal status: INITIAL  PLAN:  PT FREQUENCY: 2x/week  PT DURATION: 4 weeks  PLANNED INTERVENTIONS: Therapeutic exercises, Therapeutic activity, Neuromuscular re-education, Balance training, Gait training, Patient/Family education, Self Care, Stair training, and Re-evaluation  PLAN FOR NEXT SESSION: NuStep, lower extremity strengthening, and balance interventions   Granville Lewis, PT 07/22/2023, 6:52 PM

## 2023-07-25 ENCOUNTER — Ambulatory Visit: Payer: Medicare HMO

## 2023-07-25 DIAGNOSIS — M6281 Muscle weakness (generalized): Secondary | ICD-10-CM | POA: Diagnosis not present

## 2023-07-25 DIAGNOSIS — Z9181 History of falling: Secondary | ICD-10-CM

## 2023-07-25 NOTE — Therapy (Signed)
OUTPATIENT PHYSICAL THERAPY LOWER EXTREMITY EVALUATION   Patient Name: Kristine Carson MRN: [id number] DOB:1946-02-24, 77 y.o., female Today's Date: 07/25/2023  END OF SESSION:  PT End of Session - 07/25/23 0847     Visit Number 2    Number of Visits 8    Date for PT Re-Evaluation 09/26/23    PT Start Time 0845    PT Stop Time 0925    PT Time Calculation (min) 40 min    Activity Tolerance Patient tolerated treatment well    Behavior During Therapy [facility] for tasks assessed/performed             Past Medical History:  Diagnosis Date   Anxiety    High cholesterol    Hypertension    Thyroid disease    Past Surgical History:  Procedure Laterality Date   ABDOMINAL HYSTERECTOMY     CHOLECYSTECTOMY     There are no problems to display for this patient.  REFERRING PROVIDER: Dani Gobble, PA-C   REFERRING DIAG: Muscular deconditioning   THERAPY DIAG:  Muscle weakness (generalized)  History of falling  Rationale for Evaluation and Treatment: Rehabilitation  ONSET DATE: 05/20/23  SUBJECTIVE:   SUBJECTIVE STATEMENT: Pt denies any pain today.   PERTINENT HISTORY: Anxiety, hypertension, and dementia  PAIN:  Are you having pain? No  PRECAUTIONS: None  RED FLAGS: None   WEIGHT BEARING RESTRICTIONS: No  FALLS:  Has patient fallen in last 6 months? No  LIVING ENVIRONMENT: Lives with: lives with their spouse Lives in: House/apartment Stairs: Yes: External: 3 steps; can reach both; step to pattern Has following equipment at home: None  OCCUPATION: retired  PLOF: Independent  PATIENT GOALS: improved strength in her legs and improved stability and confidence walking (be able to walk around her church track (0.25 miles per lap))  NEXT MD VISIT: 11/02/22  OBJECTIVE:   COGNITION: Overall cognitive status: Within functional limits for tasks assessed     SENSATION: Patient reports no numbness or tingling  EDEMA:  No edema observed  POSTURE:  forward head  LOWER EXTREMITY ROM: WFL for activities assessed  LOWER EXTREMITY MMT:  MMT Right eval Left eval  Hip flexion 4+/5 4/5  Hip extension    Hip abduction    Hip adduction    Hip internal rotation    Hip external rotation    Knee flexion 4/5 4/5  Knee extension 4+/5 4+/5  Ankle dorsiflexion 4/5 4/5  Ankle plantarflexion    Ankle inversion    Ankle eversion     (Blank rows = not tested)  FUNCTIONAL TESTS:  5 times sit to stand: 17.11 seconds without UE support Timed up and go (TUG): 14.48 seconds  GAIT: Assistive device utilized: None Level of assistance: Complete Independence Comments: No significant gait deviations observed    TODAY'S TREATMENT:  DATE:                                    EXERCISE LOG  Exercise Repetitions and Resistance Comments  Nustep Lvl 3 x 15 mins   Rockerboard 3 mins   Forward Step Ups 6" box x 20 reps bil   LAQs 3# x 25 reps bil   Seated Marches 3# x 25 reps bil   Seated Hip Abduction Red x 3 mins   Seated Hip Adduction 3 mins   Seated Ham Curls Red x 25 reps bil    Sit to stand     Blank cell = exercise not performed today    PATIENT EDUCATION:  Education details: Plan of care, prognosis, objective findings, and goals for therapy Person educated: Patient Education method: Explanation Education comprehension: verbalized understanding  HOME EXERCISE PROGRAM:   ASSESSMENT:  CLINICAL IMPRESSION: Pt arrives for today's treatment session denying any pain.  Pt reports that she is slightly more fatigued today due to not sleeping well.  Pt instructed in various standing and seated BLE exercises to increase strength and function.  Pt requiring min cues for proper technique with all newly added exercises.  Pt requiring heavy cues for sequencing during forward step ups.  Pt denied any pain at completion of  today's treatment session.   OBJECTIVE IMPAIRMENTS: decreased activity tolerance, decreased mobility, and decreased strength.   ACTIVITY LIMITATIONS: carrying, standing, stairs, transfers, and locomotion level  PARTICIPATION LIMITATIONS: meal prep, cleaning, laundry, shopping, and community activity  PERSONAL FACTORS: Past/current experiences and 3+ comorbidities: Anxiety, hypertension, and dementia   are also affecting patient's functional outcome.   REHAB POTENTIAL: Good  CLINICAL DECISION MAKING: Stable/uncomplicated  EVALUATION COMPLEXITY: Low   GOALS: Goals reviewed with patient? Yes  LONG TERM GOALS: Target date: 08/19/23  Patient will be independent with her HEP. Baseline:  Goal status: INITIAL  2.  Patient will improve her 5 times sit to stand time to 12 seconds or less for an approved lower extremity power. Baseline:  Goal status: INITIAL  3.  Patient will improve her timed up and go time to 12 seconds or less to reduce her fall risk. Baseline:  Goal status: INITIAL  4.  Patient will report being able to walk at least 2 laps at her church without being limited by her familiar symptoms of weakness. Baseline:  Goal status: INITIAL  PLAN:  PT FREQUENCY: 2x/week  PT DURATION: 4 weeks  PLANNED INTERVENTIONS: Therapeutic exercises, Therapeutic activity, Neuromuscular re-education, Balance training, Gait training, Patient/Family education, Self Care, Stair training, and Re-evaluation  PLAN FOR NEXT SESSION: NuStep, lower extremity strengthening, and balance interventions   Newman Pies, PTA 07/25/2023, 9:28 AM

## 2023-07-29 ENCOUNTER — Ambulatory Visit: Payer: Medicare HMO | Attending: Physician Assistant

## 2023-07-29 DIAGNOSIS — Z9181 History of falling: Secondary | ICD-10-CM | POA: Diagnosis present

## 2023-07-29 DIAGNOSIS — M6281 Muscle weakness (generalized): Secondary | ICD-10-CM | POA: Insufficient documentation

## 2023-07-29 NOTE — Therapy (Signed)
OUTPATIENT PHYSICAL THERAPY LOWER EXTREMITY TREATMENT   Patient Name: Kristine Carson MRN: [id number] DOB:24-Apr-1946, 77 y.o., female Today's Date: 07/29/2023  END OF SESSION:  PT End of Session - 07/29/23 0849     Visit Number 3    Number of Visits 8    Date for PT Re-Evaluation 09/26/23    PT Start Time 0845    PT Stop Time 0918   Patient requested to leave early.   PT Time Calculation (min) 33 min    Activity Tolerance Patient tolerated treatment well    Behavior During Therapy WFL for tasks assessed/performed              Past Medical History:  Diagnosis Date   Anxiety    High cholesterol    Hypertension    Thyroid disease    Past Surgical History:  Procedure Laterality Date   ABDOMINAL HYSTERECTOMY     CHOLECYSTECTOMY     There are no problems to display for this patient.  REFERRING PROVIDER: Dani Gobble, PA-C   REFERRING DIAG: Muscular deconditioning   THERAPY DIAG:  Muscle weakness (generalized)  History of falling  Rationale for Evaluation and Treatment: Rehabilitation  ONSET DATE: 05/20/23  SUBJECTIVE:   SUBJECTIVE STATEMENT: Patient reports that she "is not feeling it today." She may not be able to make it for her full appointment.   PERTINENT HISTORY: Anxiety, hypertension, and dementia  PAIN:  Are you having pain? No  PRECAUTIONS: None  RED FLAGS: None   WEIGHT BEARING RESTRICTIONS: No  FALLS:  Has patient fallen in last 6 months? No  LIVING ENVIRONMENT: Lives with: lives with their spouse Lives in: House/apartment Stairs: Yes: External: 3 steps; can reach both; step to pattern Has following equipment at home: None  OCCUPATION: retired  PLOF: Independent  PATIENT GOALS: improved strength in her legs and improved stability and confidence walking (be able to walk around her church track (0.25 miles per lap))  NEXT MD VISIT: 11/02/22  OBJECTIVE:   COGNITION: Overall cognitive status: Within functional limits for  tasks assessed     SENSATION: Patient reports no numbness or tingling  EDEMA:  No edema observed  POSTURE: forward head  LOWER EXTREMITY ROM: WFL for activities assessed  LOWER EXTREMITY MMT:  MMT Right eval Left eval  Hip flexion 4+/5 4/5  Hip extension    Hip abduction    Hip adduction    Hip internal rotation    Hip external rotation    Knee flexion 4/5 4/5  Knee extension 4+/5 4+/5  Ankle dorsiflexion 4/5 4/5  Ankle plantarflexion    Ankle inversion    Ankle eversion     (Blank rows = not tested)  FUNCTIONAL TESTS:  5 times sit to stand: 17.11 seconds without UE support Timed up and go (TUG): 14.48 seconds  GAIT: Assistive device utilized: None Level of assistance: Complete Independence Comments: No significant gait deviations observed    TODAY'S TREATMENT:  DATE:                                    07/29/23 EXERCISE LOG  Exercise Repetitions and Resistance Comments  Nustep  L3 x 16 minutes   Standing HS curl  20 reps each     Side stepping  20 reps each  Without UE support   Rocker board  4 minutes  BUE support   Sit to stand  10 reps  Intermittent UE support  LAQ 3# x 2.5 minutes Alternating LE    Blank cell = exercise not performed today                                    07/25/23 EXERCISE LOG  Exercise Repetitions and Resistance Comments  Nustep Lvl 3 x 15 mins   Rockerboard 3 mins   Forward Step Ups 6" box x 20 reps bil   LAQs 3# x 25 reps bil   Seated Marches 3# x 25 reps bil   Seated Hip Abduction Red x 3 mins   Seated Hip Adduction 3 mins   Seated Ham Curls Red x 25 reps bil    Sit to stand     Blank cell = exercise not performed today    PATIENT EDUCATION:  Education details: Plan of care, prognosis, objective findings, and goals for therapy Person educated: Patient Education method: Explanation Education  comprehension: verbalized understanding  HOME EXERCISE PROGRAM:   ASSESSMENT:  CLINICAL IMPRESSION: Patient was progressed with standing interventions for improved safety with functional activities. She required minimal cueing with standing hamstring curls to facilitate knee flexion rather than hip extension. She experienced no increase in pain or discomfort with any of today's interventions. She requested to leave early from today's appointment. She reported feeling better upon the conclusion of treatment. She continues to require skilled physical therapy to address her remaining impairments to maximize her safety and functional mobility.   OBJECTIVE IMPAIRMENTS: decreased activity tolerance, decreased mobility, and decreased strength.   ACTIVITY LIMITATIONS: carrying, standing, stairs, transfers, and locomotion level  PARTICIPATION LIMITATIONS: meal prep, cleaning, laundry, shopping, and community activity  PERSONAL FACTORS: Past/current experiences and 3+ comorbidities: Anxiety, hypertension, and dementia   are also affecting patient's functional outcome.   REHAB POTENTIAL: Good  CLINICAL DECISION MAKING: Stable/uncomplicated  EVALUATION COMPLEXITY: Low   GOALS: Goals reviewed with patient? Yes  LONG TERM GOALS: Target date: 08/19/23  Patient will be independent with her HEP. Baseline:  Goal status: INITIAL  2.  Patient will improve her 5 times sit to stand time to 12 seconds or less for an approved lower extremity power. Baseline:  Goal status: INITIAL  3.  Patient will improve her timed up and go time to 12 seconds or less to reduce her fall risk. Baseline:  Goal status: INITIAL  4.  Patient will report being able to walk at least 2 laps at her church without being limited by her familiar symptoms of weakness. Baseline:  Goal status: INITIAL  PLAN:  PT FREQUENCY: 2x/week  PT DURATION: 4 weeks  PLANNED INTERVENTIONS: Therapeutic exercises, Therapeutic activity,  Neuromuscular re-education, Balance training, Gait training, Patient/Family education, Self Care, Stair training, and Re-evaluation  PLAN FOR NEXT SESSION: NuStep, lower extremity strengthening, and balance interventions   Granville Lewis, PT 07/29/2023, 9:32 AM

## 2023-07-31 ENCOUNTER — Ambulatory Visit: Payer: Medicare HMO

## 2023-07-31 DIAGNOSIS — M6281 Muscle weakness (generalized): Secondary | ICD-10-CM

## 2023-07-31 DIAGNOSIS — Z9181 History of falling: Secondary | ICD-10-CM

## 2023-07-31 NOTE — Therapy (Addendum)
 OUTPATIENT PHYSICAL THERAPY LOWER EXTREMITY TREATMENT   Patient Name: Kristine Carson MRN: [id number] DOB:1945/11/21, 77 y.o., female Today's Date: 07/31/2023  END OF SESSION:  PT End of Session - 07/31/23 0938     Visit Number 4    Number of Visits 8    Date for PT Re-Evaluation 09/26/23    PT Start Time 0930    PT Stop Time 1006   Patient requested to leave early.   PT Time Calculation (min) 36 min    Activity Tolerance Patient tolerated treatment well    Behavior During Therapy WFL for tasks assessed/performed               Past Medical History:  Diagnosis Date   Anxiety    High cholesterol    Hypertension    Thyroid disease    Past Surgical History:  Procedure Laterality Date   ABDOMINAL HYSTERECTOMY     CHOLECYSTECTOMY     There are no problems to display for this patient.  REFERRING PROVIDER: Jearlean Mince, PA-C   REFERRING DIAG: Muscular deconditioning   THERAPY DIAG:  Muscle weakness (generalized)  History of falling  Rationale for Evaluation and Treatment: Rehabilitation  ONSET DATE: 05/20/23  SUBJECTIVE:   SUBJECTIVE STATEMENT: Patient reports that she feels good today. She notes that she was able to walk from [employer] to [facility] (over 100 yards) after her last appointment.   PERTINENT HISTORY: Anxiety, hypertension, and dementia  PAIN:  Are you having pain? No  PRECAUTIONS: None  RED FLAGS: None   WEIGHT BEARING RESTRICTIONS: No  FALLS:  Has patient fallen in last 6 months? No  LIVING ENVIRONMENT: Lives with: lives with their spouse Lives in: House/apartment Stairs: Yes: External: 3 steps; can reach both; step to pattern Has following equipment at home: None  OCCUPATION: retired  PLOF: Independent  PATIENT GOALS: improved strength in her legs and improved stability and confidence walking (be able to walk around her church track (0.25 miles per lap))  NEXT MD VISIT: 11/02/22  OBJECTIVE:   COGNITION: Overall  cognitive status: Within functional limits for tasks assessed     SENSATION: Patient reports no numbness or tingling  EDEMA:  No edema observed  POSTURE: forward head  LOWER EXTREMITY ROM: WFL for activities assessed  LOWER EXTREMITY MMT:  MMT Right eval Left eval  Hip flexion 4+/5 4/5  Hip extension    Hip abduction    Hip adduction    Hip internal rotation    Hip external rotation    Knee flexion 4/5 4/5  Knee extension 4+/5 4+/5  Ankle dorsiflexion 4/5 4/5  Ankle plantarflexion    Ankle inversion    Ankle eversion     (Blank rows = not tested)  FUNCTIONAL TESTS:  5 times sit to stand: 17.11 seconds without UE support Timed up and go (TUG): 14.48 seconds  GAIT: Assistive device utilized: None Level of assistance: Complete Independence Comments: No significant gait deviations observed    TODAY'S TREATMENT:  DATE:                                    07/31/23 EXERCISE LOG  Exercise Repetitions and Resistance Comments  Nustep L4 x 17 minutes   Step up  6" step x 2.5 minutes Alternating LE; BUE support  Static stance on foam  2.5 minutes  Wide BOS; dual tasking; without UE support  Marching on foam 3 minutes  BUE support   Sit to stand  12 reps   Seated HS curl  Green t-band x 15 reps each     Blank cell = exercise not performed today                                    07/29/23 EXERCISE LOG  Exercise Repetitions and Resistance Comments  Nustep  L3 x 16 minutes   Standing HS curl  20 reps each     Side stepping  20 reps each  Without UE support   Rocker board  4 minutes  BUE support   Sit to stand  10 reps  Intermittent UE support  LAQ 3# x 2.5 minutes Alternating LE    Blank cell = exercise not performed today                                    07/25/23 EXERCISE LOG  Exercise Repetitions and Resistance Comments  Nustep Lvl 3 x 15  mins   Rockerboard 3 mins   Forward Step Ups 6" box x 20 reps bil   LAQs 3# x 25 reps bil   Seated Marches 3# x 25 reps bil   Seated Hip Abduction Red x 3 mins   Seated Hip Adduction 3 mins   Seated Ham Curls Red x 25 reps bil    Sit to stand     Blank cell = exercise not performed today    PATIENT EDUCATION:  Education details: Plan of care, prognosis, objective findings, and goals for therapy Person educated: Patient Education method: Explanation Education comprehension: verbalized understanding  HOME EXERCISE PROGRAM:   ASSESSMENT:  CLINICAL IMPRESSION: Patient was progressed with step ups and multiple interventions for improved lower extremity stability. She required minimal cueing with step ups for improved foot clearance to improved safety. She fatigued quickly with today's interventions and she requested to leave today's appointment early. She reported feeling good upon the conclusion of treatment. She continues to require skilled physical therapy to address her remaining impairments to return to her prior level of function.   PHYSICAL THERAPY DISCHARGE SUMMARY  Visits from Start of Care: 4  Current functional level related to goals / functional outcomes: Patient was unable to meet her goals for skilled physical therapy as she did not complete her recommended plan of care.    Remaining deficits: Muscular strength, endurance, and power   Education / Equipment: HEP   Patient agrees to discharge. Patient goals were not met. Patient is being discharged due to not returning since the last visit.  Glendora Landsman, PT, DPT   OBJECTIVE IMPAIRMENTS: decreased activity tolerance, decreased mobility, and decreased strength.   ACTIVITY LIMITATIONS: carrying, standing, stairs, transfers, and locomotion level  PARTICIPATION LIMITATIONS: meal prep, cleaning, laundry, shopping, and community activity  PERSONAL FACTORS: Past/current experiences and  3+ comorbidities: Anxiety,  hypertension, and dementia  are also affecting patient's functional outcome.   REHAB POTENTIAL: Good  CLINICAL DECISION MAKING: Stable/uncomplicated  EVALUATION COMPLEXITY: Low   GOALS: Goals reviewed with patient? Yes  LONG TERM GOALS: Target date: 08/19/23  Patient will be independent with her HEP. Baseline:  Goal status: INITIAL  2.  Patient will improve her 5 times sit to stand time to 12 seconds or less for an approved lower extremity power. Baseline:  Goal status: INITIAL  3.  Patient will improve her timed up and go time to 12 seconds or less to reduce her fall risk. Baseline:  Goal status: INITIAL  4.  Patient will report being able to walk at least 2 laps at her church without being limited by her familiar symptoms of weakness. Baseline:  Goal status: INITIAL  PLAN:  PT FREQUENCY: 2x/week  PT DURATION: 4 weeks  PLANNED INTERVENTIONS: Therapeutic exercises, Therapeutic activity, Neuromuscular re-education, Balance training, Gait training, Patient/Family education, Self Care, Stair training, and Re-evaluation  PLAN FOR NEXT SESSION: NuStep, lower extremity strengthening, and balance interventions   Lane Pinon, PT 07/31/2023, 10:24 AM

## 2024-03-04 ENCOUNTER — Other Ambulatory Visit: Payer: Self-pay | Admitting: Physician Assistant

## 2024-03-04 DIAGNOSIS — Z1231 Encounter for screening mammogram for malignant neoplasm of breast: Secondary | ICD-10-CM

## 2024-04-06 ENCOUNTER — Other Ambulatory Visit (HOSPITAL_BASED_OUTPATIENT_CLINIC_OR_DEPARTMENT_OTHER): Payer: Self-pay | Admitting: Physician Assistant

## 2024-04-06 DIAGNOSIS — M8580 Other specified disorders of bone density and structure, unspecified site: Secondary | ICD-10-CM

## 2024-06-24 ENCOUNTER — Ambulatory Visit
Admission: RE | Admit: 2024-06-24 | Discharge: 2024-06-24 | Disposition: A | Discharge status: Home / Self Care | Source: Ambulatory Visit | Attending: Physician Assistant | Admitting: Physician Assistant

## 2024-06-24 DIAGNOSIS — Z1231 Encounter for screening mammogram for malignant neoplasm of breast: Secondary | ICD-10-CM

## 2024-08-12 ENCOUNTER — Ambulatory Visit (HOSPITAL_BASED_OUTPATIENT_CLINIC_OR_DEPARTMENT_OTHER)
Admission: RE | Admit: 2024-08-12 | Discharge: 2024-08-12 | Disposition: A | Discharge status: Home / Self Care | Source: Ambulatory Visit | Attending: Physician Assistant | Admitting: Physician Assistant

## 2024-08-12 DIAGNOSIS — Z780 Asymptomatic menopausal state: Secondary | ICD-10-CM | POA: Diagnosis present

## 2024-08-12 DIAGNOSIS — M8580 Other specified disorders of bone density and structure, unspecified site: Secondary | ICD-10-CM | POA: Insufficient documentation

## 2024-11-09 ENCOUNTER — Other Ambulatory Visit: Payer: Self-pay

## 2024-11-09 ENCOUNTER — Encounter: Payer: Self-pay | Admitting: Physical Therapy

## 2024-11-09 ENCOUNTER — Ambulatory Visit: Attending: Physician Assistant | Admitting: Physical Therapy

## 2024-11-09 DIAGNOSIS — M6281 Muscle weakness (generalized): Secondary | ICD-10-CM | POA: Insufficient documentation

## 2024-11-09 DIAGNOSIS — M5459 Other low back pain: Secondary | ICD-10-CM | POA: Insufficient documentation

## 2024-11-09 DIAGNOSIS — Z9181 History of falling: Secondary | ICD-10-CM | POA: Diagnosis present

## 2024-11-09 DIAGNOSIS — M62830 Muscle spasm of back: Secondary | ICD-10-CM | POA: Diagnosis present

## 2024-11-09 NOTE — Therapy (Signed)
 " OUTPATIENT PHYSICAL THERAPY THORACOLUMBAR EVALUATION   Patient Name: Kristine Carson MRN: [id number] DOB:Oct 26, 1946, 79 y.o., female Today's Date: 11/09/2024  END OF SESSION:  PT End of Session - 11/09/24 1135     Visit Number 1    Number of Visits 12    Date for Recertification  12/21/24    PT Start Time 1015    PT Stop Time 1116    PT Time Calculation (min) 61 min    Activity Tolerance Patient tolerated treatment well    Behavior During Therapy WFL for tasks assessed/performed          Past Medical History:  Diagnosis Date   Anxiety    High cholesterol    Hypertension    Thyroid disease    Past Surgical History:  Procedure Laterality Date   ABDOMINAL HYSTERECTOMY     CHOLECYSTECTOMY     There are no active problems to display for this patient.   REFERRING PROVIDER: Camie JAYSON Mirza  REFERRING DIAG: Chronic bilateral low back pain without Sciatica.  Rationale for Evaluation and Treatment: Rehabilitation  THERAPY DIAG:  Other low back pain  Muscle spasm of back  ONSET DATE: ~3 months.    SUBJECTIVE:                                                                                                                                                                                           SUBJECTIVE STATEMENT: The patient presents to the clinic with ongoing low back pain over the last 3 months.  She states her pain was on both sides but recent injections have been helpful and she now experiences most pain on the right.  Her pain is low today but can increase to higher levels with increased activity and bending.  Sitting decreases her pain.    PERTINENT HISTORY:  See above.    PAIN:  Are you having pain? Yes: NPRS scale: 1-2/20. Pain location: Right low back.   Pain description: Sharp.   Aggravating factors: As above.   Relieving factors: As above.    PRECAUTIONS: None  RED FLAGS: None   WEIGHT BEARING RESTRICTIONS: No  FALLS:  Has patient fallen in  last 6 months? No  LIVING ENVIRONMENT: Lives with: lives with their spouse Lives in: House/apartment Has following equipment at home: None  OCCUPATION: Retired.    PLOF: Independent  PATIENT GOALS: Move better with less pain.    OBJECTIVE:   PATIENT SURVEYS:  ODI:  18/50.  POSTURE: rounded shoulders, forward head, and flexed trunk   PALPATION: Tender to palpation over right low back/SIJ region.  Increased left lumbar musculature  tone.    LUMBAR ROM:   WFL.  LOWER EXTREMITY ROM:     WNL.    LOWER EXTREMITY MMT:    WNL.    LUMBAR SPECIAL TESTS:  Right LE significantly shorter than left which appears to be related to a right posterior pelvic rotation.  Improved after reverse muscle energy technique.  Normal bilateral DTR's.  (-) SLR testing.    GAIT/TRANSITIONS: Antalgic gait pattern with patient walking in a flexed trunk posture.  Transitory movements such as sit to stand, sit to supine to sit are performed slowly and painfully.    TREATMENT DATE: 11/09/24:  HMP and IFC at 80-150 Hz on 40% scan   x 15 minutes f/b STW/M x 10 minutes with patient in sdly position.   Normal modality response following removal of modality.                                                                                                                            PATIENT EDUCATION:  Education details:  Person educated:  International aid/development worker:  Education comprehension:   HOME EXERCISE PROGRAM:   ASSESSMENT:  CLINICAL IMPRESSION: The patient presents to [facility] with c/o right-sided low back pain.  She tender to palpation over right low back/SIJ region and has increased left lumbar musculature tone.  Seh had a notable leg length length discrepancy (right shorter than left) which appears due to a right posterior pelvic rotation.   Her LE DTR's are normal as is her strength.  Her ODI score is 18/50.  Patient will benefit from skilled physical therapy intervention to address pain and deficits.     OBJECTIVE IMPAIRMENTS: Abnormal gait, increased muscle spasms, postural dysfunction, and pain.   ACTIVITY LIMITATIONS: carrying, lifting, bending, standing, and locomotion level  PARTICIPATION LIMITATIONS: meal prep, cleaning, laundry, shopping, community activity, yard work, and church  PERSONAL FACTORS: Time since onset of injury/illness/exacerbation are also affecting patient's functional outcome.   REHAB POTENTIAL: Good  CLINICAL DECISION MAKING: Evolving/moderate complexity  EVALUATION COMPLEXITY: Moderate   GOALS:  SHORT TERM GOALS: Target date: 11/23/24  Ind with an initial HEP. Goal status: INITIAL   LONG TERM GOALS: Target date: 12/21/24  Ind with an advanced HEP.  Goal status: INITIAL  2.  Perform ADL's with pain not > 2-3/10.  Goal status: INITIAL  3.  Improve ODI score by at least 3 points.  Goal status: INITIAL  PLAN:  PT FREQUENCY: 2x/week  PT DURATION: 6 weeks  PLANNED INTERVENTIONS: 97110-Therapeutic exercises, 97530- Therapeutic activity, [id number]- Neuromuscular re-education, 97535- Self Care, 02859- Manual therapy, G0283- Electrical stimulation (unattended), 97035- Ultrasound, Patient/Family education, and Moist heat.  PLAN FOR NEXT SESSION: Core exercise progression.  Body mechanics training and spinal protection techniques.  Modalities and STW/M as needed.     Jaren Kearn, PT 11/09/2024, 12:05 PM  "

## 2024-11-12 ENCOUNTER — Ambulatory Visit: Admitting: Physical Therapy

## 2024-11-12 DIAGNOSIS — M5459 Other low back pain: Secondary | ICD-10-CM

## 2024-11-12 DIAGNOSIS — M62830 Muscle spasm of back: Secondary | ICD-10-CM

## 2024-11-12 NOTE — Therapy (Signed)
 " OUTPATIENT PHYSICAL THERAPY THORACOLUMBAR TREATMENT Patient Name: Kristine Carson MRN: [id number] DOB:[birth year], 79 y.o., female Today's Date: 11/12/2024  END OF SESSION:  PT End of Session - 11/12/24 1056     Visit Number 2    Number of Visits 12    Date for Recertification  12/21/24    PT Start Time 0930    PT Stop Time 1023    PT Time Calculation (min) 53 min    Activity Tolerance Patient tolerated treatment well    Behavior During Therapy WFL for tasks assessed/performed           Past Medical History:  Diagnosis Date   Anxiety    High cholesterol    Hypertension    Thyroid disease    Past Surgical History:  Procedure Laterality Date   ABDOMINAL HYSTERECTOMY     CHOLECYSTECTOMY     There are no active problems to display for this patient.   REFERRING PROVIDER: Camie JAYSON Mirza  REFERRING DIAG: Chronic bilateral low back pain without Sciatica.  Rationale for Evaluation and Treatment: Rehabilitation  THERAPY DIAG:  Other low back pain  Muscle spasm of back  ONSET DATE: ~3 months.    SUBJECTIVE:                                                                                                                                                                                           SUBJECTIVE STATEMENT: Pain low since first treatment.  PERTINENT HISTORY:  See above.    PAIN:  Are you having pain? Yes: NPRS scale: 1-2/20. Pain location: Right low back.   Pain description: Sharp.   Aggravating factors: As above.   Relieving factors: As above.    PRECAUTIONS: None  RED FLAGS: None   WEIGHT BEARING RESTRICTIONS: No  FALLS:  Has patient fallen in last 6 months? No  LIVING ENVIRONMENT: Lives with: lives with their spouse Lives in: House/apartment Has following equipment at home: None  OCCUPATION: Retired.    PLOF: Independent  PATIENT GOALS: Move better with less pain.    OBJECTIVE:   PATIENT SURVEYS:  ODI:  18/50.  POSTURE: rounded  shoulders, forward head, and flexed trunk   PALPATION: Tender to palpation over right low back/SIJ region.  Increased left lumbar musculature tone.    LUMBAR ROM:   WFL.  LOWER EXTREMITY ROM:     WNL.    LOWER EXTREMITY MMT:    WNL.    LUMBAR SPECIAL TESTS:  Right LE significantly shorter than left which appears to be related to a right posterior pelvic rotation.  Improved after reverse muscle energy technique.  Normal bilateral DTR's.  (-) SLR testing.    GAIT/TRANSITIONS: Antalgic gait pattern with patient walking in a flexed trunk posture.  Transitory movements such as sit to stand, sit to supine to sit are performed slowly and painfully.    TREATMENT DATE:   11/12/24:  Nustep level 3 x 12 minutes f/b gentle femoral traction, manual adductor squeezes and reverse muscle energy technique f/b STW/M to patient's right low back region (12 minutes total) f/b HMP and IFC at 80-150 Hz on 40% scan x 20 minutes.     11/09/24:  HMP and IFC at 80-150 Hz on 40% scan   x 15 minutes f/b STW/M x 10 minutes with patient in sdly position.   Normal modality response following removal of modality.                                                                                                                            PATIENT EDUCATION:  Education details: See below. Person educated: Patient. Education method: Handout.   Education comprehension: Very good.    HOME EXERCISE PROGRAM: HOME EXERCISE PROGRAM [[id number]]  Adductor Squeeze MET -  Repeat 4 Repetitions, Hold 5 Seconds, Perform 2  Supine resisted right hip flexion.  ASSESSMENT:  CLINICAL IMPRESSION: The patient has had an excellent response to treatments.  Added an HEP which she performed with excellent technique.      OBJECTIVE IMPAIRMENTS: Abnormal gait, increased muscle spasms, postural dysfunction, and pain.   ACTIVITY LIMITATIONS: carrying, lifting, bending, standing, and locomotion level  PARTICIPATION LIMITATIONS: meal  prep, cleaning, laundry, shopping, community activity, yard work, and church  PERSONAL FACTORS: Time since onset of injury/illness/exacerbation are also affecting patient's functional outcome.   REHAB POTENTIAL: Good  CLINICAL DECISION MAKING: Evolving/moderate complexity  EVALUATION COMPLEXITY: Moderate   GOALS:  SHORT TERM GOALS: Target date: 11/23/24  Ind with an initial HEP. Goal status: INITIAL   LONG TERM GOALS: Target date: 12/21/24  Ind with an advanced HEP.  Goal status: INITIAL  2.  Perform ADL's with pain not > 2-3/10.  Goal status: INITIAL  3.  Improve ODI score by at least 3 points.  Goal status: INITIAL  PLAN:  PT FREQUENCY: 2x/week  PT DURATION: 6 weeks  PLANNED INTERVENTIONS: 97110-Therapeutic exercises, 97530- Therapeutic activity, [id number]- Neuromuscular re-education, 97535- Self Care, 02859- Manual therapy, G0283- Electrical stimulation (unattended), 97035- Ultrasound, Patient/Family education, and Moist heat.  PLAN FOR NEXT SESSION: Core exercise progression.  Body mechanics training and spinal protection techniques.  Modalities and STW/M as needed.     Shandi Godfrey, PT 11/12/2024, 11:32 AM  "

## 2024-11-16 ENCOUNTER — Encounter: Payer: Self-pay | Admitting: Physical Therapy

## 2024-11-16 ENCOUNTER — Ambulatory Visit: Admitting: Physical Therapy

## 2024-11-16 DIAGNOSIS — M5459 Other low back pain: Secondary | ICD-10-CM

## 2024-11-16 DIAGNOSIS — M6281 Muscle weakness (generalized): Secondary | ICD-10-CM

## 2024-11-16 DIAGNOSIS — M62830 Muscle spasm of back: Secondary | ICD-10-CM

## 2024-11-16 DIAGNOSIS — Z9181 History of falling: Secondary | ICD-10-CM

## 2024-11-16 NOTE — Therapy (Signed)
 " OUTPATIENT PHYSICAL THERAPY THORACOLUMBAR TREATMENT Patient Name: Kristine Carson MRN: [id number] DOB:1946/06/19, 79 y.o., female Today's Date: 11/16/2024  END OF SESSION:  PT End of Session - 11/16/24 1056     Visit Number 3    Number of Visits 12    Date for Recertification  12/21/24    PT Start Time 1100    PT Stop Time 1140    PT Time Calculation (min) 40 min    Activity Tolerance Patient tolerated treatment well    Behavior During Therapy WFL for tasks assessed/performed            Past Medical History:  Diagnosis Date   Anxiety    High cholesterol    Hypertension    Thyroid disease    Past Surgical History:  Procedure Laterality Date   ABDOMINAL HYSTERECTOMY     CHOLECYSTECTOMY     There are no active problems to display for this patient.   REFERRING PROVIDER: Camie JAYSON Mirza  REFERRING DIAG: Chronic bilateral low back pain without Sciatica.  Rationale for Evaluation and Treatment: Rehabilitation  THERAPY DIAG:  Other low back pain  Muscle spasm of back  Muscle weakness (generalized)  History of falling  ONSET DATE: ~3 months.    SUBJECTIVE:                                                                                                                                                                                           SUBJECTIVE STATEMENT: Pt states she's been doing good. Still feels a little part of it on the right side of her back.   PERTINENT HISTORY:  See above.    PAIN:  Are you having pain? Yes: NPRS scale: 1-2/10. Pain location: Right low back.   Pain description: Sharp.   Aggravating factors: As above.   Relieving factors: As above.    PRECAUTIONS: None  RED FLAGS: None   WEIGHT BEARING RESTRICTIONS: No  FALLS:  Has patient fallen in last 6 months? No  LIVING ENVIRONMENT: Lives with: lives with their spouse Lives in: House/apartment Has following equipment at home: None  OCCUPATION: Retired.    PLOF:  Independent  PATIENT GOALS: Move better with less pain.    OBJECTIVE:   PATIENT SURVEYS:  ODI:  18/50.  POSTURE: rounded shoulders, forward head, and flexed trunk   PALPATION: Tender to palpation over right low back/SIJ region.  Increased left lumbar musculature tone.    LUMBAR ROM:  WFL.  LOWER EXTREMITY ROM:    WNL.    LOWER EXTREMITY MMT:   WNL.    LUMBAR SPECIAL TESTS:  Right LE significantly  shorter than left which appears to be related to a right posterior pelvic rotation.  Improved after reverse muscle energy technique.  Normal bilateral DTR's.  (-) SLR testing.    GAIT/TRANSITIONS: Antalgic gait pattern with patient walking in a flexed trunk posture.  Transitory movements such as sit to stand, sit to supine to sit are performed slowly and painfully.    TREATMENT DATE:  11/16/24: Nustep L3 x 15 min Manual therapy: STM & TPR lumbar paraspinal and QL Sidelying QL stretch x2 min with rolled towel under side Supine LTR x10 Self care: Self massage at home with tennis ball, e-stim placement, HEP   11/12/24:  Nustep level 3 x 12 minutes f/b gentle femoral traction, manual adductor squeezes and reverse muscle energy technique f/b STW/M to patient's right low back region (12 minutes total) f/b HMP and IFC at 80-150 Hz on 40% scan x 20 minutes.     11/09/24:  HMP and IFC at 80-150 Hz on 40% scan   x 15 minutes f/b STW/M x 10 minutes with patient in sdly position.   Normal modality response following removal of modality.                                                                                                                            PATIENT EDUCATION:  Education details: See below. Person educated: Patient. Education method: Handout.   Education comprehension: Very good.    HOME EXERCISE PROGRAM: HOME EXERCISE PROGRAM [[id number]]  Adductor Squeeze MET -  Repeat 4 Repetitions, Hold 5 Seconds, Perform 2  Supine resisted right hip flexion.  Access Code:  [email or web address] URL: [email or web address] Date: 11/16/2024 Prepared by: Khira Cudmore April Earnie Starring  Exercises - Supine Lower Trunk Rotation  - 1 x daily - 7 x weekly - 1 sets - 10 reps - 3 sec hold - Sidelying Quadratus Lumborum Stretch on Table  - 1 x daily - 7 x weekly - 1 sets - 2 reps - 1-2 min hold - Standing Glute Med Mobilization with Small Ball on Wall  - 1 x daily - 7 x weekly - 1 sets - 1-2 min hold  ASSESSMENT:  CLINICAL IMPRESSION: Pt reports some continued R side low back pain. Otherwise, pain levels have remained very low. Found a trigger point in pt's R QL and lumbar paraspinal which eased after manual work and stretches.   OBJECTIVE IMPAIRMENTS: Abnormal gait, increased muscle spasms, postural dysfunction, and pain.   ACTIVITY LIMITATIONS: carrying, lifting, bending, standing, and locomotion level  PARTICIPATION LIMITATIONS: meal prep, cleaning, laundry, shopping, community activity, yard work, and church  PERSONAL FACTORS: Time since onset of injury/illness/exacerbation are also affecting patient's functional outcome.   REHAB POTENTIAL: Good  CLINICAL DECISION MAKING: Evolving/moderate complexity  EVALUATION COMPLEXITY: Moderate   GOALS:  SHORT TERM GOALS: Target date: 11/23/24  Ind with an initial HEP. Goal status: INITIAL   LONG TERM GOALS: Target date: 12/21/24  Ind with an advanced HEP.  Goal status:  INITIAL  2.  Perform ADL's with pain not > 2-3/10.  Goal status: INITIAL  3.  Improve ODI score by at least 3 points.  Goal status: INITIAL  PLAN:  PT FREQUENCY: 2x/week  PT DURATION: 6 weeks  PLANNED INTERVENTIONS: 97110-Therapeutic exercises, 97530- Therapeutic activity, [id number]- Neuromuscular re-education, 97535- Self Care, 02859- Manual therapy, G0283- Electrical stimulation (unattended), 97035- Ultrasound, Patient/Family education, and Moist heat.  PLAN FOR NEXT SESSION: Core exercise progression.  Body mechanics training and spinal  protection techniques.  Modalities and STW/M as needed.     Eulogio Requena April Ma L Jerri Hargadon, PT, DPT 11/16/2024, 10:57 AM  "

## 2024-11-25 ENCOUNTER — Ambulatory Visit: Payer: Self-pay | Admitting: Physical Therapy

## 2024-11-25 DIAGNOSIS — M62830 Muscle spasm of back: Secondary | ICD-10-CM

## 2024-11-25 DIAGNOSIS — M5459 Other low back pain: Secondary | ICD-10-CM | POA: Diagnosis not present

## 2024-11-25 NOTE — Therapy (Signed)
 " OUTPATIENT PHYSICAL THERAPY THORACOLUMBAR TREATMENT Patient Name: Kristine Carson MRN: [id number] DOB:10/19/1946, 79 y.o., female Today's Date: 11/25/2024  END OF SESSION:  PT End of Session - 11/25/24 1155     Visit Number 4    Number of Visits 12    Date for Recertification  12/21/24    PT Start Time 1015    PT Stop Time 1119    PT Time Calculation (min) 64 min    Activity Tolerance Patient tolerated treatment well    Behavior During Therapy WFL for tasks assessed/performed             Past Medical History:  Diagnosis Date   Anxiety    High cholesterol    Hypertension    Thyroid disease    Past Surgical History:  Procedure Laterality Date   ABDOMINAL HYSTERECTOMY     CHOLECYSTECTOMY     There are no active problems to display for this patient.   REFERRING PROVIDER: Camie JAYSON Mirza  REFERRING DIAG: Chronic bilateral low back pain without Sciatica.  Rationale for Evaluation and Treatment: Rehabilitation  THERAPY DIAG:  No diagnosis found.  ONSET DATE: ~3 months.    SUBJECTIVE:                                                                                                                                                                                           SUBJECTIVE STATEMENT: Pain much lower since starting PT.   PERTINENT HISTORY:  See above.    PAIN:  Are you having pain? Yes: NPRS scale: 1-2/10. Pain location: Right low back.   Pain description: Sharp.   Aggravating factors: As above.   Relieving factors: As above.    PRECAUTIONS: None  RED FLAGS: None   WEIGHT BEARING RESTRICTIONS: No  FALLS:  Has patient fallen in last 6 months? No  LIVING ENVIRONMENT: Lives with: lives with their spouse Lives in: House/apartment Has following equipment at home: None  OCCUPATION: Retired.    PLOF: Independent  PATIENT GOALS: Move better with less pain.    OBJECTIVE:   PATIENT SURVEYS:  ODI:  18/50.  POSTURE: rounded shoulders, forward  head, and flexed trunk   PALPATION: Tender to palpation over right low back/SIJ region.  Increased left lumbar musculature tone.    LUMBAR ROM:  WFL.  LOWER EXTREMITY ROM:    WNL.    LOWER EXTREMITY MMT:   WNL.    LUMBAR SPECIAL TESTS:  Right LE significantly shorter than left which appears to be related to a right posterior pelvic rotation.  Improved after reverse muscle energy technique.  Normal bilateral DTR's.  (-)  SLR testing.    GAIT/TRANSITIONS: Antalgic gait pattern with patient walking in a flexed trunk posture.  Transitory movements such as sit to stand, sit to supine to sit are performed slowly and painfully.    TREATMENT DATE:   11/25/24:                                     EXERCISE LOG  Exercise Repetitions and Resistance Comments  Nustep Level 3 x 20 minutes   Back ext machine 50# x 4 minutes for spinal range of motion and strengthening).    Ab curl machine 50# x 4 minutes for spinal range of motion and strengthening).    Seated ball squeeze 4 minutes   Sit to stands without armrest use 2 x 10   Standing at counter with UE support Bilateral hip extension 2 x 10.   Hip bridges in supine 2 x 10.   HMP and IFC at 80-150 Hz on 40% scan x 20 minutes.  Normal modality response following removal of modality.       11/16/24: Nustep L3 x 15 min Manual therapy: STM & TPR lumbar paraspinal and QL Sidelying QL stretch x2 min with rolled towel under side Supine LTR x10 Self care: Self massage at home with tennis ball, e-stim placement, HEP   11/12/24:  Nustep level 3 x 12 minutes f/b gentle femoral traction, manual adductor squeezes and reverse muscle energy technique f/b STW/M to patient's right low back region (12 minutes total) f/b HMP and IFC at 80-150 Hz on 40% scan x 20 minutes.     11/09/24:  HMP and IFC at 80-150 Hz on 40% scan   x 15 minutes f/b STW/M x 10 minutes with patient in sdly position.   Normal modality response following removal of modality.                                                                                                                             PATIENT EDUCATION:  Education details: See below. Person educated: Patient. Education method: Handout.   Education comprehension: Very good.    HOME EXERCISE PROGRAM: HOME EXERCISE PROGRAM [[id number]]  Adductor Squeeze MET -  Repeat 4 Repetitions, Hold 5 Seconds, Perform 2  Supine resisted right hip flexion.  Access Code: [email or web address] URL: [email or web address] Date: 11/16/2024 Prepared by: Gellen April Earnie Starring  Exercises - Supine Lower Trunk Rotation  - 1 x daily - 7 x weekly - 1 sets - 10 reps - 3 sec hold - Sidelying Quadratus Lumborum Stretch on Table  - 1 x daily - 7 x weekly - 1 sets - 2 reps - 1-2 min hold - Standing Glute Med Mobilization with Small Ball on Wall  - 1 x daily - 7 x weekly - 1 sets - 1-2 min hold  ASSESSMENT:  CLINICAL IMPRESSION: Patient doing much better overall.  She reports  a low pain-level.  She obtained a lumbar roll and finds it very helpful.    OBJECTIVE IMPAIRMENTS: Abnormal gait, increased muscle spasms, postural dysfunction, and pain.   ACTIVITY LIMITATIONS: carrying, lifting, bending, standing, and locomotion level  PARTICIPATION LIMITATIONS: meal prep, cleaning, laundry, shopping, community activity, yard work, and church  PERSONAL FACTORS: Time since onset of injury/illness/exacerbation are also affecting patient's functional outcome.   REHAB POTENTIAL: Good  CLINICAL DECISION MAKING: Evolving/moderate complexity  EVALUATION COMPLEXITY: Moderate   GOALS:  SHORT TERM GOALS: Target date: 11/23/24  Ind with an initial HEP. Goal status: INITIAL   LONG TERM GOALS: Target date: 12/21/24  Ind with an advanced HEP.  Goal status: INITIAL  2.  Perform ADL's with pain not > 2-3/10.  Goal status: INITIAL  3.  Improve ODI score by at least 3 points.  Goal status: INITIAL  PLAN:  PT FREQUENCY:  2x/week  PT DURATION: 6 weeks  PLANNED INTERVENTIONS: 97110-Therapeutic exercises, 97530- Therapeutic activity, [id number]- Neuromuscular re-education, 97535- Self Care, [id number]- Manual therapy, [id number]- Electrical stimulation (unattended), 97035- Ultrasound, Patient/Family education, and Moist heat.  PLAN FOR NEXT SESSION: Core exercise progression.  Body mechanics training and spinal protection techniques.  Modalities and STW/M as needed.     Slayden Mennenga, PT, DPT 11/25/2024, 11:56 AM  "

## 2024-11-30 ENCOUNTER — Ambulatory Visit: Payer: Self-pay | Admitting: *Deleted

## 2024-12-02 ENCOUNTER — Ambulatory Visit: Attending: Physician Assistant | Admitting: Physical Therapy

## 2024-12-02 DIAGNOSIS — M62830 Muscle spasm of back: Secondary | ICD-10-CM

## 2024-12-02 DIAGNOSIS — M5459 Other low back pain: Secondary | ICD-10-CM

## 2024-12-02 NOTE — Therapy (Signed)
 " OUTPATIENT PHYSICAL THERAPY THORACOLUMBAR TREATMENT Patient Name: Kristine Carson MRN: [id number] DOB:Jul 17, 1946, 79 y.o., female Today's Date: 12/02/2024  END OF SESSION:  PT End of Session - 12/02/24 1526     Visit Number 5    Number of Visits 12    Date for Recertification  12/21/24    PT Start Time 0230    PT Stop Time 0341    PT Time Calculation (min) 71 min    Activity Tolerance Patient tolerated treatment well    Behavior During Therapy [facility] for tasks assessed/performed              Past Medical History:  Diagnosis Date   Anxiety    High cholesterol    Hypertension    Thyroid disease    Past Surgical History:  Procedure Laterality Date   ABDOMINAL HYSTERECTOMY     CHOLECYSTECTOMY     There are no active problems to display for this patient.   REFERRING PROVIDER: Camie JAYSON Mirza  REFERRING DIAG: Chronic bilateral low back pain without Sciatica.  Rationale for Evaluation and Treatment: Rehabilitation  THERAPY DIAG:  Other low back pain  Muscle spasm of back  ONSET DATE: ~3 months.    SUBJECTIVE:                                                                                                                                                                                           SUBJECTIVE STATEMENT: Back is doing much better.  PERTINENT HISTORY:  See above.    PAIN:  Are you having pain? Yes: NPRS scale: 1-2/10. Pain location: Right low back.   Pain description: Sharp.   Aggravating factors: As above.   Relieving factors: As above.    PRECAUTIONS: None  RED FLAGS: None   WEIGHT BEARING RESTRICTIONS: No  FALLS:  Has patient fallen in last 6 months? No  LIVING ENVIRONMENT: Lives with: lives with their spouse Lives in: House/apartment Has following equipment at home: None  OCCUPATION: Retired.    PLOF: Independent  PATIENT GOALS: Move better with less pain.    OBJECTIVE:   PATIENT SURVEYS:  ODI:  18/50.  POSTURE: rounded  shoulders, forward head, and flexed trunk   PALPATION: Tender to palpation over right low back/SIJ region.  Increased left lumbar musculature tone.    LUMBAR ROM:  WFL.  LOWER EXTREMITY ROM:    WNL.    LOWER EXTREMITY MMT:   WNL.    LUMBAR SPECIAL TESTS:  Right LE significantly shorter than left which appears to be related to a right posterior pelvic rotation.  Improved after reverse muscle energy technique.  Normal bilateral DTR's.  (-) SLR testing.    GAIT/TRANSITIONS: Antalgic gait pattern with patient walking in a flexed trunk posture.  Transitory movements such as sit to stand, sit to supine to sit are performed slowly and painfully.    TREATMENT DATE:   12/02/24:                                     EXERCISE LOG  Exercise Repetitions and Resistance Comments  Nustep L3 x 15 minutes    Back ext machine 50# x 4 minutes for spinal range of motion and strengthening).    Ab curl machine 50# x 4 minutes for spinal range of motion and strengthening).            STW/M x 15 minutes to bilateral lumbar musculature and right SIJ f/b HMP and IFC at 80-150 Hz on 40% scan x 20 minutes.    11/25/24:                                     EXERCISE LOG  Exercise Repetitions and Resistance Comments  Nustep Level 3 x 20 minutes   Back ext machine 50# x 4 minutes for spinal range of motion and strengthening).    Ab curl machine 50# x 4 minutes for spinal range of motion and strengthening).    Seated ball squeeze 4 minutes   Sit to stands without armrest use 2 x 10   Standing at counter with UE support Bilateral hip extension 2 x 10.   Hip bridges in supine 2 x 10.   HMP and IFC at 80-150 Hz on 40% scan x 20 minutes.  Normal modality response following removal of modality.       11/16/24: Nustep L3 x 15 min Manual therapy: STM & TPR lumbar paraspinal and QL Sidelying QL stretch x2 min with rolled towel under side Supine LTR x10 Self care: Self massage at home with tennis ball, e-stim  placement, HEP   11/12/24:  Nustep level 3 x 12 minutes f/b gentle femoral traction, manual adductor squeezes and reverse muscle energy technique f/b STW/M to patient's right low back region (12 minutes total) f/b HMP and IFC at 80-150 Hz on 40% scan x 20 minutes.     11/09/24:  HMP and IFC at 80-150 Hz on 40% scan   x 15 minutes f/b STW/M x 10 minutes with patient in sdly position.   Normal modality response following removal of modality.                                                                                                                            PATIENT EDUCATION:  Education details: See below. Person educated: Patient. Education method: Handout.   Education comprehension: Very good.    HOME EXERCISE PROGRAM: HOME  EXERCISE PROGRAM [[id number]]  Adductor Squeeze MET -  Repeat 4 Repetitions, Hold 5 Seconds, Perform 2  Supine resisted right hip flexion.  Access Code: [email or web address] URL: [email or web address] Date: 11/16/2024 Prepared by: Gellen April Earnie Starring  Exercises - Supine Lower Trunk Rotation  - 1 x daily - 7 x weekly - 1 sets - 10 reps - 3 sec hold - Sidelying Quadratus Lumborum Stretch on Table  - 1 x daily - 7 x weekly - 1 sets - 2 reps - 1-2 min hold - Standing Glute Med Mobilization with Small Ball on Wall  - 1 x daily - 7 x weekly - 1 sets - 1-2 min hold  ASSESSMENT:  CLINICAL IMPRESSION: Patient doing much better overall.  She reports a low pain-level.  She is progressing well toward goals.  OBJECTIVE IMPAIRMENTS: Abnormal gait, increased muscle spasms, postural dysfunction, and pain.   ACTIVITY LIMITATIONS: carrying, lifting, bending, standing, and locomotion level  PARTICIPATION LIMITATIONS: meal prep, cleaning, laundry, shopping, community activity, yard work, and church  PERSONAL FACTORS: Time since onset of injury/illness/exacerbation are also affecting patient's functional outcome.   REHAB POTENTIAL: Good  CLINICAL DECISION  MAKING: Evolving/moderate complexity  EVALUATION COMPLEXITY: Moderate   GOALS:  SHORT TERM GOALS: Target date: 11/23/24  Ind with an initial HEP. Goal status: MET.  LONG TERM GOALS: Target date: 12/21/24  Ind with an advanced HEP.  Goal status: Ongoing.    2.  Perform ADL's with pain not > 2-3/10.  Goal status: Partially met.    3.  Improve ODI score by at least 3 points.  Goal status: INITIAL  PLAN:  PT FREQUENCY: 2x/week  PT DURATION: 6 weeks  PLANNED INTERVENTIONS: 97110-Therapeutic exercises, 97530- Therapeutic activity, [id number]- Neuromuscular re-education, 97535- Self Care, [id number]- Manual therapy, [id number]- Electrical stimulation (unattended), 97035- Ultrasound, Patient/Family education, and Moist heat.  PLAN FOR NEXT SESSION: Core exercise progression.  Body mechanics training and spinal protection techniques.  Modalities and STW/M as needed.     Charmeka [place], PT, DPT 12/02/2024, 3:54 PM  "

## 2024-12-08 ENCOUNTER — Ambulatory Visit: Admitting: Physical Therapy

## 2024-12-10 ENCOUNTER — Ambulatory Visit: Admitting: Physical Therapy
